# Patient Record
Sex: Female | Born: 1945 | Race: White | Hispanic: No | Marital: Married
Health system: Southern US, Community
[De-identification: ages and names within clinical notes are randomized; demographics above are authoritative.]

## PROBLEM LIST (undated history)

## (undated) DIAGNOSIS — R9431 Abnormal electrocardiogram [ECG] [EKG]: Secondary | ICD-10-CM

## (undated) DIAGNOSIS — F419 Anxiety disorder, unspecified: Secondary | ICD-10-CM

## (undated) DIAGNOSIS — R011 Cardiac murmur, unspecified: Secondary | ICD-10-CM

## (undated) DIAGNOSIS — E538 Deficiency of other specified B group vitamins: Secondary | ICD-10-CM

## (undated) DIAGNOSIS — M130 Polyarthritis, unspecified: Secondary | ICD-10-CM

## (undated) DIAGNOSIS — J329 Chronic sinusitis, unspecified: Secondary | ICD-10-CM

## (undated) DIAGNOSIS — I10 Essential (primary) hypertension: Secondary | ICD-10-CM

## (undated) DIAGNOSIS — F339 Major depressive disorder, recurrent, unspecified: Secondary | ICD-10-CM

## (undated) DIAGNOSIS — E039 Hypothyroidism, unspecified: Secondary | ICD-10-CM

## (undated) DIAGNOSIS — J309 Allergic rhinitis, unspecified: Secondary | ICD-10-CM

## (undated) DIAGNOSIS — E559 Vitamin D deficiency, unspecified: Secondary | ICD-10-CM

## (undated) DIAGNOSIS — Z6839 Body mass index (BMI) 39.0-39.9, adult: Secondary | ICD-10-CM

## (undated) HISTORY — DX: Allergic rhinitis, unspecified: J30.9

## (undated) HISTORY — DX: Cardiac murmur, unspecified: R01.1

## (undated) HISTORY — DX: Essential (primary) hypertension: I10

## (undated) HISTORY — DX: Abnormal electrocardiogram (ECG) (EKG): R94.31

## (undated) HISTORY — DX: Body mass index (BMI) 39.0-39.9, adult: Z68.39

## (undated) HISTORY — DX: Hypomagnesemia: E83.42

## (undated) HISTORY — DX: Hypothyroidism, unspecified: E03.9

## (undated) HISTORY — DX: Deficiency of other specified B group vitamins: E53.8

## (undated) HISTORY — DX: Vitamin D deficiency, unspecified: E55.9

## (undated) HISTORY — DX: Major depressive disorder, recurrent, unspecified: F33.9

## (undated) HISTORY — DX: Anxiety disorder, unspecified: F41.9

## (undated) HISTORY — DX: Chronic sinusitis, unspecified: J32.9

## (undated) HISTORY — DX: Polyarthritis, unspecified: M13.0

---

## 2016-01-04 ENCOUNTER — Inpatient Hospital Stay (HOSPITAL_COMMUNITY)
Admission: EM | Admit: 2016-01-04 | Discharge: 2016-01-09 | DRG: 378 | Disposition: A | Discharge status: Home / Self Care | Payer: Medicare Other | Source: Ambulatory Visit | Attending: Family Medicine | Admitting: Family Medicine

## 2016-01-04 DIAGNOSIS — D649 Anemia, unspecified: Secondary | ICD-10-CM | POA: Diagnosis not present

## 2016-01-04 DIAGNOSIS — Z880 Allergy status to penicillin: Secondary | ICD-10-CM

## 2016-01-04 DIAGNOSIS — F13239 Sedative, hypnotic or anxiolytic dependence with withdrawal, unspecified: Secondary | ICD-10-CM | POA: Diagnosis not present

## 2016-01-04 DIAGNOSIS — N179 Acute kidney failure, unspecified: Secondary | ICD-10-CM | POA: Diagnosis present

## 2016-01-04 DIAGNOSIS — Z6841 Body Mass Index (BMI) 40.0 and over, adult: Secondary | ICD-10-CM | POA: Diagnosis not present

## 2016-01-04 DIAGNOSIS — E872 Acidosis: Secondary | ICD-10-CM | POA: Diagnosis present

## 2016-01-04 DIAGNOSIS — F32A Depression, unspecified: Secondary | ICD-10-CM | POA: Diagnosis present

## 2016-01-04 DIAGNOSIS — F329 Major depressive disorder, single episode, unspecified: Secondary | ICD-10-CM | POA: Diagnosis present

## 2016-01-04 DIAGNOSIS — E871 Hypo-osmolality and hyponatremia: Secondary | ICD-10-CM | POA: Diagnosis not present

## 2016-01-04 DIAGNOSIS — E869 Volume depletion, unspecified: Secondary | ICD-10-CM | POA: Diagnosis not present

## 2016-01-04 DIAGNOSIS — K922 Gastrointestinal hemorrhage, unspecified: Principal | ICD-10-CM | POA: Diagnosis present

## 2016-01-04 DIAGNOSIS — R140 Abdominal distension (gaseous): Secondary | ICD-10-CM | POA: Diagnosis not present

## 2016-01-04 DIAGNOSIS — I10 Essential (primary) hypertension: Secondary | ICD-10-CM

## 2016-01-04 DIAGNOSIS — K921 Melena: Secondary | ICD-10-CM | POA: Diagnosis not present

## 2016-01-04 DIAGNOSIS — R4182 Altered mental status, unspecified: Secondary | ICD-10-CM

## 2016-01-04 DIAGNOSIS — E039 Hypothyroidism, unspecified: Secondary | ICD-10-CM | POA: Diagnosis not present

## 2016-01-04 DIAGNOSIS — K644 Residual hemorrhoidal skin tags: Secondary | ICD-10-CM | POA: Diagnosis present

## 2016-01-04 DIAGNOSIS — R296 Repeated falls: Secondary | ICD-10-CM | POA: Diagnosis not present

## 2016-01-04 DIAGNOSIS — I872 Venous insufficiency (chronic) (peripheral): Secondary | ICD-10-CM | POA: Diagnosis not present

## 2016-01-04 DIAGNOSIS — R410 Disorientation, unspecified: Secondary | ICD-10-CM | POA: Diagnosis present

## 2016-01-04 DIAGNOSIS — R319 Hematuria, unspecified: Secondary | ICD-10-CM | POA: Diagnosis present

## 2016-01-04 DIAGNOSIS — K449 Diaphragmatic hernia without obstruction or gangrene: Secondary | ICD-10-CM | POA: Diagnosis present

## 2016-01-04 DIAGNOSIS — M7989 Other specified soft tissue disorders: Secondary | ICD-10-CM | POA: Diagnosis not present

## 2016-01-04 DIAGNOSIS — I959 Hypotension, unspecified: Secondary | ICD-10-CM | POA: Diagnosis present

## 2016-01-04 DIAGNOSIS — K625 Hemorrhage of anus and rectum: Secondary | ICD-10-CM | POA: Diagnosis not present

## 2016-01-04 DIAGNOSIS — B379 Candidiasis, unspecified: Secondary | ICD-10-CM | POA: Diagnosis present

## 2016-01-04 DIAGNOSIS — N390 Urinary tract infection, site not specified: Secondary | ICD-10-CM | POA: Diagnosis present

## 2016-01-04 DIAGNOSIS — R062 Wheezing: Secondary | ICD-10-CM

## 2016-01-04 DIAGNOSIS — E785 Hyperlipidemia, unspecified: Secondary | ICD-10-CM | POA: Diagnosis present

## 2016-01-04 DIAGNOSIS — Z881 Allergy status to other antibiotic agents status: Secondary | ICD-10-CM

## 2016-01-04 DIAGNOSIS — I119 Hypertensive heart disease without heart failure: Secondary | ICD-10-CM | POA: Diagnosis not present

## 2016-01-04 DIAGNOSIS — Z8249 Family history of ischemic heart disease and other diseases of the circulatory system: Secondary | ICD-10-CM

## 2016-01-04 DIAGNOSIS — Z888 Allergy status to other drugs, medicaments and biological substances status: Secondary | ICD-10-CM

## 2016-01-04 DIAGNOSIS — Z79899 Other long term (current) drug therapy: Secondary | ICD-10-CM

## 2016-01-04 HISTORY — DX: Depression, unspecified: F32.A

## 2016-01-04 HISTORY — DX: Gastrointestinal hemorrhage, unspecified: K92.2

## 2016-01-04 HISTORY — DX: Essential (primary) hypertension: I10

## 2016-01-04 HISTORY — DX: Hyperlipidemia, unspecified: E78.5

## 2016-01-04 LAB — CBC
HEMATOCRIT: 29.4 % — AB (ref 36.0–46.0)
HEMOGLOBIN: 9.8 g/dL — AB (ref 12.0–15.0)
MCH: 28.8 pg (ref 26.0–34.0)
MCHC: 33.3 g/dL (ref 30.0–36.0)
MCV: 86.5 fL (ref 78.0–100.0)
Platelets: 353 10*3/uL (ref 150–400)
RBC: 3.4 MIL/uL — ABNORMAL LOW (ref 3.87–5.11)
RDW: 14.1 % (ref 11.5–15.5)
WBC: 9.3 10*3/uL (ref 4.0–10.5)

## 2016-01-04 LAB — PROTIME-INR
INR: 1.06
PROTHROMBIN TIME: 13.8 s (ref 11.4–15.2)

## 2016-01-04 LAB — COMPREHENSIVE METABOLIC PANEL
ALT: 20 U/L (ref 14–54)
AST: 34 U/L (ref 15–41)
Albumin: 3.8 g/dL (ref 3.5–5.0)
Alkaline Phosphatase: 69 U/L (ref 38–126)
Anion gap: 6 (ref 5–15)
BILIRUBIN TOTAL: 0.7 mg/dL (ref 0.3–1.2)
BUN: 32 mg/dL — AB (ref 6–20)
CHLORIDE: 104 mmol/L (ref 101–111)
CO2: 23 mmol/L (ref 22–32)
CREATININE: 1.34 mg/dL — AB (ref 0.44–1.00)
Calcium: 9.3 mg/dL (ref 8.9–10.3)
GFR calc Af Amer: 46 mL/min — ABNORMAL LOW (ref >60)
GFR, EST NON AFRICAN AMERICAN: 40 mL/min — AB (ref >60)
Glucose, Bld: 106 mg/dL — ABNORMAL HIGH (ref 65–99)
Potassium: 4.5 mmol/L (ref 3.5–5.1)
Sodium: 133 mmol/L — ABNORMAL LOW (ref 135–145)
Total Protein: 7.6 g/dL (ref 6.5–8.1)

## 2016-01-04 LAB — TSH: TSH: 2.727 u[IU]/mL (ref 0.350–4.500)

## 2016-01-04 MED ORDER — METOPROLOL TARTRATE 25 MG PO TABS: 25.0000 mg | ORAL_TABLET | Freq: Two times a day (BID) | ORAL | Status: DC

## 2016-01-04 MED ORDER — ONDANSETRON HCL 4 MG/2ML IJ SOLN
4.0000 mg | Freq: Four times a day (QID) | INTRAMUSCULAR | Status: DC | PRN
  Administered 2016-01-04: 4 mg via INTRAVENOUS
  Filled 2016-01-04: qty 2

## 2016-01-04 MED ORDER — LEVOTHYROXINE SODIUM 112 MCG PO TABS
112.0000 ug | ORAL_TABLET | Freq: Every day | ORAL | Status: DC
  Administered 2016-01-05 – 2016-01-09 (×5): 112 ug via ORAL
  Filled 2016-01-04 (×5): qty 1

## 2016-01-04 MED ORDER — ONDANSETRON HCL 4 MG PO TABS: 4.0000 mg | ORAL_TABLET | Freq: Four times a day (QID) | ORAL | Status: DC | PRN

## 2016-01-04 MED ORDER — SIMVASTATIN 10 MG PO TABS
20.0000 mg | ORAL_TABLET | Freq: Every day | ORAL | Status: DC
  Administered 2016-01-04 – 2016-01-08 (×5): 20 mg via ORAL
  Filled 2016-01-04 (×5): qty 2

## 2016-01-04 MED ORDER — TRAMADOL HCL 50 MG PO TABS
25.0000 mg | ORAL_TABLET | Freq: Four times a day (QID) | ORAL | Status: DC | PRN
  Administered 2016-01-04 – 2016-01-05 (×2): 25 mg via ORAL
  Filled 2016-01-04 (×2): qty 1

## 2016-01-04 MED ORDER — ALPRAZOLAM 0.5 MG PO TABS
0.5000 mg | ORAL_TABLET | Freq: Four times a day (QID) | ORAL | Status: DC | PRN
  Administered 2016-01-04 – 2016-01-06 (×4): 0.5 mg via ORAL
  Filled 2016-01-04 (×5): qty 1

## 2016-01-04 MED ORDER — PEG 3350-KCL-NA BICARB-NACL 420 G PO SOLR
4000.0000 mL | Freq: Once | ORAL | Status: AC
  Administered 2016-01-04: 4000 mL via ORAL

## 2016-01-04 MED ORDER — CITALOPRAM HYDROBROMIDE 20 MG PO TABS
40.0000 mg | ORAL_TABLET | Freq: Every day | ORAL | Status: DC
  Administered 2016-01-04 – 2016-01-06 (×3): 40 mg via ORAL
  Filled 2016-01-04 (×3): qty 2

## 2016-01-04 MED ORDER — SODIUM CHLORIDE 0.9 % IV SOLN
INTRAVENOUS | Status: DC
  Administered 2016-01-04: 17:00:00 via INTRAVENOUS

## 2016-01-04 MED ORDER — AMITRIPTYLINE HCL 25 MG PO TABS
50.0000 mg | ORAL_TABLET | Freq: Every day | ORAL | Status: DC
  Administered 2016-01-04 – 2016-01-06 (×3): 50 mg via ORAL
  Filled 2016-01-04 (×4): qty 2

## 2016-01-04 NOTE — Progress Notes (Signed)
RN received report, Pt arrived unit from [facility]. Pt is alert and oriented, able to communicate needs. MD notified of Pt's location. Will continue with current plan of care.

## 2016-01-04 NOTE — Consult Note (Signed)
UNASSIGNED PATIENT Reason for Consult: Rectal bleeding and anemia. Referring Physician: THP   HPI: Diane Hernandez is a 70 year old, morbidly white female who was transferred to [facility] after she presented there with a three-day history of rectal bleeding. Initially she complained of vaginal bleeding but on exam at the hospital she was found to have rectal bleeding and was transferred here as there was no gastroenterologist available at that hospital for consultation. The symptoms have been going on for the last 3 days she has a sense of fecal urgency and the commode gets filled with blood mixed with stool. She tends to have occasional bouts of constipation where she can go 2-3 days without a BM. At times she has 1-2 loose stools per day. She denies having any abdominal pain nausea or vomiting. She has a good appetite is good and her weight has been stable. There is no history of ulcers jaundice or colitis. There is history of nonsteroidal use or abuse. Patient denies family history of colon cancer celiac sprue or IBD. Patient has never had a colonoscopy.    PAST MEDICAL HISTORY 1) Hypertension  2) Hyperlipidemia  3) Hypothyroidism 4) Depression  5) Morbid obesity  PAST SURGICAL HISTORY  Patient has not had any surgeries.   FAMILY HISTORY There is no family history of colon cancer; her parents died of CAD. Several family mebers have problems with depression.  SOCIAL HISTORY  has no tobacco, alcohol, and drug history on file.  Allergies:  Allergies  Allergen Reactions  . Omnicef [Cefdinir] Diarrhea  . Penicillins Hives    Has patient had a PCN reaction causing immediate rash, facial/tongue/throat swelling, SOB or lightheadedness with hypotension: No Has patient had a PCN reaction causing severe rash involving mucus membranes or skin necrosis: Yes Has patient had a PCN reaction that required hospitalization No Has patient had a PCN reaction occurring within the last  10 years: No If all of the above answers are "NO", then may proceed with Cephalosporin use.   . Prednisone Other (See Comments)    Makes very jittery    Medications: I have reviewed the patient's current medications.  Results for orders placed or performed during the hospital encounter of 01/04/16 (from the past 48 hour(s))  CBC     Status: Abnormal   Collection Time: 01/04/16  3:25 PM  Result Value Ref Range   WBC 9.3 4.0 - 10.5 K/uL   RBC 3.40 (L) 3.87 - 5.11 MIL/uL   Hemoglobin 9.8 (L) 12.0 - 15.0 g/dL   HCT 16.129.4 (L) 09.636.0 - 04.546.0 %   MCV 86.5 78.0 - 100.0 fL   MCH 28.8 26.0 - 34.0 pg   MCHC 33.3 30.0 - 36.0 g/dL   RDW 40.914.1 81.111.5 - 91.415.5 %   Platelets 353 150 - 400 K/uL   Review of Systems  Constitutional: Negative.   HENT: Negative.   Eyes: Negative.   Respiratory: Negative.   Cardiovascular: Negative.   Gastrointestinal: Positive for blood in stool.  Genitourinary: Negative.   Musculoskeletal: Negative.   Skin: Negative.   Neurological: Negative.   Endo/Heme/Allergies: Negative.   Psychiatric/Behavioral: Positive for depression.   Blood pressure 134/63, pulse 87, temperature 98.5 F (36.9 C), temperature source Oral, resp. rate 16, SpO2 99 %. Physical Exam  Constitutional: She is oriented to person, place, and time. She appears well-developed and well-nourished.  HENT:  Head: Normocephalic and atraumatic.  Neck: Normal range of motion. Neck supple.  Cardiovascular: Normal rate and regular rhythm.  Respiratory: Effort normal and breath sounds normal.  GI: Soft. Bowel sounds are normal.  Musculoskeletal: Normal range of motion.  Neurological: She is alert and oriented to person, place, and time.  Skin: Skin is warm and dry.  Psychiatric: She has a normal mood and affect. Her behavior is normal. Judgment and thought content normal.   Assessment/Plan: 1) Rectal bleeding and anemia: will plan a colonoscopy for tomorrow. Will prep tonite. 2)  Hypertension/hyperlipidemia. 3) Depression  4) Hypothyroidism.  5) Morbid obesity. Riyansh Gerstner 01/04/2016, 3:42 PM

## 2016-01-04 NOTE — H&P (Addendum)
History and Physical    Diane DallasBrenda Pfund ZOX:[id number] DOB: Sep 23, 1945 DOA: 01/04/2016  PCP: Dema SeverinYORK,REGINA F, NP  Patient coming from: [facility]  Chief Complaint: Bright red blood  HPI: Diane Hernandez is a 70 y.o. female with medical history significant of hypertension, hyperlipidemia, obesity, hypothyroidism, presents [facility] the [facility] emergency room with complaints of 3 day of what she initially thought was vaginal bleeding. Patient describes that she has been incontinent and rushing to the bathroom, and she saw bright red blood in the toilet bowl. She thinks she had about a dozen episodes over the last 3 days, this morning's episode was with a lot of blood, she got scared and decided to come to the emergency room. This has never happened to her before. She never had a colonoscopy, does not have a gastroenterologist. She denies any fever or chills, denies any chest pain, complains of mild lightheadedness denies any dizziness. She has no abdominal pain, no nausea, vomiting or diarrhea. She was about in the emergency room there, she underwent a pelvic exam which showed no evidence of vaginal bleeding however digital rectal bleeding. Her vital signs are stable, blood work remarkable for hemoglobin of 9.8 with unknown baseline. White count is 9.8, platelets are 306, sodium 1:30, potassium 4.7, chloride 101, bicarbonate 20, BUN 36 and creatinine 1.5. Glucose was 97. Liver enzymes are unremarkable. [facility] not have a GI coverage and patient was transferred to [facility] for [facility] evaluation.  In addition, patient complains of recurrent falls, poor balance, and recently had a rib fracture.  Review of Systems: As per HPI otherwise 10 point review of systems negative.   Past medical history Hypothyroidism Hypertension Hyperlipidemia Obesity Depression  Social history Denies smoking, denies any alcohol intake  Family history Her father had a heart attack, mother with Alzheimer  dementia  Home medications Lisinopril 10 mg daily Synthroid 112 mcg Daily  Metoprolol 25 mg twice a day Simvastatin 20 mg daily Amitriptyline 50 mg daily Medroxyprogesterone 2.5 mg Celexa 40 mg daily Xanax 0.5 mg 4 times daily as needed Tramadol 50 mg as needed for pain  Allergies  Cefdinir - diarrhea Penicillins - Hives Prednisone - irregular heart beat   Physical Exam: Vitals:   01/04/16 1433  BP: 134/63  Pulse: 87  Resp: 16  Temp: 98.5 F (36.9 C)  TempSrc: Oral  SpO2: 99%    Constitutional: NAD, calm, comfortable Vitals:   01/04/16 1433  BP: 134/63  Pulse: 87  Resp: 16  Temp: 98.5 F (36.9 C)  TempSrc: Oral  SpO2: 99%   Eyes: PERRL, lids and conjunctivae normal ENMT: Mucous membranes are moist. Posterior pharynx clear of any exudate or lesions  Neck: normal, supple  Respiratory: clear to auscultation bilaterally, no wheezing, no crackles. Normal respiratory effort. No accessory muscle use.  Cardiovascular: Regular rate and rhythm, no murmurs / rubs / gallops. 1+ extremity edema. 2+ pedal pulses.  Abdomen: no tenderness, no masses palpated. Bowel sounds positive.  Musculoskeletal: no clubbing / cyanosis. Normal muscle tone.  Skin: no rashes, lesions, ulcers. No induration Neurologic: non focal Psychiatric: Alert and oriented x 3. Normal mood.   Labs on Admission: I have personally reviewed following labs and imaging studies  CBC: No results for input(s): WBC, NEUTROABS, HGB, HCT, MCV, PLT in the last 168 hours. Basic Metabolic Panel: No results for input(s): NA, K, CL, CO2, GLUCOSE, BUN, CREATININE, CALCIUM, MG, PHOS in the last 168 hours. GFR: CrCl cannot be calculated (Unknown ideal weight.). Liver Function  Tests: No results for input(s): AST, ALT, ALKPHOS, BILITOT, PROT, ALBUMIN in the last 168 hours. No results for input(s): LIPASE, AMYLASE in the last 168 hours. No results for input(s): AMMONIA in the last 168 hours. Coagulation Profile: No  results for input(s): INR, PROTIME in the last 168 hours. Cardiac Enzymes: No results for input(s): CKTOTAL, CKMB, CKMBINDEX, TROPONINI in the last 168 hours. BNP (last 3 results) No results for input(s): PROBNP in the last 8760 hours. HbA1C: No results for input(s): HGBA1C in the last 72 hours. CBG: No results for input(s): GLUCAP in the last 168 hours. Lipid Profile: No results for input(s): CHOL, HDL, LDLCALC, TRIG, CHOLHDL, LDLDIRECT in the last 72 hours. Thyroid Function Tests: No results for input(s): TSH, T4TOTAL, FREET4, T3FREE, THYROIDAB in the last 72 hours. Anemia Panel: No results for input(s): VITAMINB12, FOLATE, FERRITIN, TIBC, IRON, RETICCTPCT in the last 72 hours. Urine analysis: No results found for: COLORURINE, APPEARANCEUR, LABSPEC, PHURINE, GLUCOSEU, HGBUR, BILIRUBINUR, KETONESUR, PROTEINUR, UROBILINOGEN, NITRITE, LEUKOCYTESUR    Assessment/Plan Active Problems:   GI bleed   Hypothyroidism   HTN (hypertension)   Depression   HLD (hyperlipidemia)    GI bleed - appears to have lower GI bleeding, possibly diverticular - GHI consulted, discussed with Dr. Loreta Ave - check CBC now, keep NPO - appears hemodynamically stable - Gentle fluids   Hypothyroidism  - Resume Synthroid, check TSH  Hypertension  - Hold lisinopril due to creatinine 1.5   ? Chronic kidney disease vs AKI - Creatinine 1.5, unknown baseline, continue to monitor  Hyperlipidemia - Continue simvastatin  Bilateral lower extremity swelling - Likely venous insufficiency, she is no orthopnea, no crackles on exam, no JVD, less likely cardiac - TED compression  Recurrent falls - Obtain PT consult  DVT prophylaxis: SCD  Code Status: Full  Family Communication: husband bedside Disposition Plan: medsurg Consults called: GI  Admission status: obs    Pamella Pert, MD [facility] Pager [phone]  If 7PM-7AM, please contact night-coverage [email or web address] Password  [facility]  01/04/2016, 2:56 PM

## 2016-01-05 ENCOUNTER — Observation Stay (HOSPITAL_COMMUNITY): Payer: Medicare Other

## 2016-01-05 ENCOUNTER — Encounter (HOSPITAL_COMMUNITY)
Admission: EM | Disposition: A | Discharge status: Home / Self Care | Payer: Self-pay | Source: Ambulatory Visit | Attending: Family Medicine

## 2016-01-05 ENCOUNTER — Encounter (HOSPITAL_COMMUNITY): Payer: Self-pay

## 2016-01-05 DIAGNOSIS — K922 Gastrointestinal hemorrhage, unspecified: Secondary | ICD-10-CM | POA: Diagnosis present

## 2016-01-05 DIAGNOSIS — K449 Diaphragmatic hernia without obstruction or gangrene: Secondary | ICD-10-CM | POA: Diagnosis present

## 2016-01-05 DIAGNOSIS — D62 Acute posthemorrhagic anemia: Secondary | ICD-10-CM | POA: Diagnosis not present

## 2016-01-05 DIAGNOSIS — I10 Essential (primary) hypertension: Secondary | ICD-10-CM | POA: Diagnosis not present

## 2016-01-05 DIAGNOSIS — E785 Hyperlipidemia, unspecified: Secondary | ICD-10-CM | POA: Diagnosis present

## 2016-01-05 DIAGNOSIS — K644 Residual hemorrhoidal skin tags: Secondary | ICD-10-CM | POA: Diagnosis present

## 2016-01-05 DIAGNOSIS — E039 Hypothyroidism, unspecified: Secondary | ICD-10-CM | POA: Diagnosis present

## 2016-01-05 DIAGNOSIS — Z6841 Body Mass Index (BMI) 40.0 and over, adult: Secondary | ICD-10-CM | POA: Diagnosis not present

## 2016-01-05 DIAGNOSIS — R140 Abdominal distension (gaseous): Secondary | ICD-10-CM | POA: Diagnosis present

## 2016-01-05 DIAGNOSIS — B379 Candidiasis, unspecified: Secondary | ICD-10-CM | POA: Diagnosis present

## 2016-01-05 DIAGNOSIS — E872 Acidosis: Secondary | ICD-10-CM | POA: Diagnosis present

## 2016-01-05 DIAGNOSIS — I872 Venous insufficiency (chronic) (peripheral): Secondary | ICD-10-CM | POA: Diagnosis present

## 2016-01-05 DIAGNOSIS — K625 Hemorrhage of anus and rectum: Secondary | ICD-10-CM | POA: Diagnosis not present

## 2016-01-05 DIAGNOSIS — F13239 Sedative, hypnotic or anxiolytic dependence with withdrawal, unspecified: Secondary | ICD-10-CM | POA: Diagnosis present

## 2016-01-05 DIAGNOSIS — E871 Hypo-osmolality and hyponatremia: Secondary | ICD-10-CM | POA: Diagnosis present

## 2016-01-05 DIAGNOSIS — R410 Disorientation, unspecified: Secondary | ICD-10-CM | POA: Diagnosis present

## 2016-01-05 DIAGNOSIS — E869 Volume depletion, unspecified: Secondary | ICD-10-CM | POA: Diagnosis present

## 2016-01-05 DIAGNOSIS — I959 Hypotension, unspecified: Secondary | ICD-10-CM

## 2016-01-05 DIAGNOSIS — F329 Major depressive disorder, single episode, unspecified: Secondary | ICD-10-CM | POA: Diagnosis present

## 2016-01-05 DIAGNOSIS — D649 Anemia, unspecified: Secondary | ICD-10-CM | POA: Diagnosis present

## 2016-01-05 DIAGNOSIS — K921 Melena: Secondary | ICD-10-CM | POA: Diagnosis present

## 2016-01-05 DIAGNOSIS — M7989 Other specified soft tissue disorders: Secondary | ICD-10-CM | POA: Diagnosis present

## 2016-01-05 DIAGNOSIS — R296 Repeated falls: Secondary | ICD-10-CM | POA: Diagnosis present

## 2016-01-05 DIAGNOSIS — R319 Hematuria, unspecified: Secondary | ICD-10-CM | POA: Diagnosis present

## 2016-01-05 DIAGNOSIS — N179 Acute kidney failure, unspecified: Secondary | ICD-10-CM | POA: Diagnosis present

## 2016-01-05 DIAGNOSIS — I119 Hypertensive heart disease without heart failure: Secondary | ICD-10-CM | POA: Diagnosis present

## 2016-01-05 DIAGNOSIS — N390 Urinary tract infection, site not specified: Secondary | ICD-10-CM | POA: Diagnosis present

## 2016-01-05 HISTORY — PX: FLEXIBLE SIGMOIDOSCOPY: SHX5431

## 2016-01-05 HISTORY — PX: ESOPHAGOGASTRODUODENOSCOPY: SHX5428

## 2016-01-05 LAB — CBC
HEMATOCRIT: 23.3 % — AB (ref 36.0–46.0)
Hemoglobin: 7.8 g/dL — ABNORMAL LOW (ref 12.0–15.0)
MCH: 28.5 pg (ref 26.0–34.0)
MCHC: 33.5 g/dL (ref 30.0–36.0)
MCV: 85 fL (ref 78.0–100.0)
Platelets: 302 10*3/uL (ref 150–400)
RBC: 2.74 MIL/uL — ABNORMAL LOW (ref 3.87–5.11)
RDW: 14.2 % (ref 11.5–15.5)
WBC: 10.3 10*3/uL (ref 4.0–10.5)

## 2016-01-05 LAB — BASIC METABOLIC PANEL
Anion gap: 9 (ref 5–15)
BUN: 32 mg/dL — AB (ref 6–20)
CHLORIDE: 103 mmol/L (ref 101–111)
CO2: 19 mmol/L — AB (ref 22–32)
Calcium: 8.5 mg/dL — ABNORMAL LOW (ref 8.9–10.3)
Creatinine, Ser: 1.36 mg/dL — ABNORMAL HIGH (ref 0.44–1.00)
GFR calc Af Amer: 45 mL/min — ABNORMAL LOW (ref >60)
GFR calc non Af Amer: 39 mL/min — ABNORMAL LOW (ref >60)
GLUCOSE: 127 mg/dL — AB (ref 65–99)
POTASSIUM: 4.5 mmol/L (ref 3.5–5.1)
SODIUM: 131 mmol/L — AB (ref 135–145)

## 2016-01-05 LAB — LACTIC ACID, PLASMA
Lactic Acid, Venous: 1.1 mmol/L (ref 0.5–1.9)
Lactic Acid, Venous: 1.7 mmol/L (ref 0.5–1.9)

## 2016-01-05 LAB — ABO/RH: ABO/RH(D): A POS

## 2016-01-05 LAB — MRSA PCR SCREENING: MRSA by PCR: POSITIVE — AB

## 2016-01-05 LAB — OSMOLALITY: Osmolality: 282 mOsm/kg (ref 275–295)

## 2016-01-05 LAB — HEMOGLOBIN AND HEMATOCRIT, BLOOD
HCT: 22.7 % — ABNORMAL LOW (ref 36.0–46.0)
HEMOGLOBIN: 7.8 g/dL — AB (ref 12.0–15.0)

## 2016-01-05 LAB — PREPARE RBC (CROSSMATCH)

## 2016-01-05 SURGERY — COLONOSCOPY
Anesthesia: Moderate Sedation | Laterality: Left

## 2016-01-05 SURGERY — EGD (ESOPHAGOGASTRODUODENOSCOPY)
Anesthesia: Moderate Sedation

## 2016-01-05 MED ORDER — SODIUM CHLORIDE 0.9 % IV SOLN
Freq: Once | INTRAVENOUS | Status: AC
  Administered 2016-01-05: 16:00:00 via INTRAVENOUS

## 2016-01-05 MED ORDER — FAMOTIDINE IN NACL 20-0.9 MG/50ML-% IV SOLN
20.0000 mg | Freq: Two times a day (BID) | INTRAVENOUS | Status: DC
  Administered 2016-01-05 – 2016-01-09 (×9): 20 mg via INTRAVENOUS
  Filled 2016-01-05 (×12): qty 50

## 2016-01-05 MED ORDER — LORAZEPAM 2 MG/ML IJ SOLN
INTRAMUSCULAR | Status: AC
  Filled 2016-01-05: qty 1

## 2016-01-05 MED ORDER — MIDAZOLAM HCL 5 MG/5ML IJ SOLN
INTRAMUSCULAR | Status: DC | PRN
  Administered 2016-01-05 (×2): 2 mg via INTRAVENOUS

## 2016-01-05 MED ORDER — METOPROLOL TARTRATE 12.5 MG HALF TABLET
12.5000 mg | ORAL_TABLET | Freq: Two times a day (BID) | ORAL | Status: DC
  Administered 2016-01-05 – 2016-01-09 (×7): 12.5 mg via ORAL
  Filled 2016-01-05 (×8): qty 1

## 2016-01-05 MED ORDER — MUPIROCIN 2 % EX OINT
1.0000 "application " | TOPICAL_OINTMENT | Freq: Two times a day (BID) | CUTANEOUS | Status: DC
  Administered 2016-01-05 – 2016-01-09 (×8): 1 via NASAL
  Filled 2016-01-05 (×2): qty 22

## 2016-01-05 MED ORDER — FENTANYL CITRATE (PF) 100 MCG/2ML IJ SOLN
INTRAMUSCULAR | Status: AC
  Filled 2016-01-05: qty 2

## 2016-01-05 MED ORDER — ORAL CARE MOUTH RINSE
15.0000 mL | Freq: Two times a day (BID) | OROMUCOSAL | Status: DC
  Administered 2016-01-05 – 2016-01-09 (×6): 15 mL via OROMUCOSAL

## 2016-01-05 MED ORDER — FENTANYL CITRATE (PF) 100 MCG/2ML IJ SOLN
INTRAMUSCULAR | Status: DC | PRN
  Administered 2016-01-05: 25 ug via INTRAVENOUS

## 2016-01-05 MED ORDER — SODIUM CHLORIDE 0.9 % IV SOLN
Freq: Once | INTRAVENOUS | Status: AC
  Administered 2016-01-05: 17:00:00 via INTRAVENOUS

## 2016-01-05 MED ORDER — POLYETHYLENE GLYCOL 3350 17 GM/SCOOP PO POWD
238.0000 g | Freq: Once | ORAL | Status: AC
  Administered 2016-01-05: 238 g via ORAL
  Filled 2016-01-05: qty 238

## 2016-01-05 MED ORDER — HALOPERIDOL LACTATE 5 MG/ML IJ SOLN
5.0000 mg | Freq: Once | INTRAMUSCULAR | Status: AC
  Administered 2016-01-05: 5 mg via INTRAVENOUS
  Filled 2016-01-05: qty 1

## 2016-01-05 MED ORDER — MIDAZOLAM HCL 5 MG/ML IJ SOLN
INTRAMUSCULAR | Status: AC
  Filled 2016-01-05: qty 2

## 2016-01-05 MED ORDER — CHLORHEXIDINE GLUCONATE CLOTH 2 % EX PADS
6.0000 | MEDICATED_PAD | Freq: Every day | CUTANEOUS | Status: DC
  Administered 2016-01-06 – 2016-01-09 (×4): 6 via TOPICAL

## 2016-01-05 MED ORDER — SODIUM CHLORIDE 0.9 % IV SOLN: INTRAVENOUS | Status: DC

## 2016-01-05 MED ORDER — SODIUM CHLORIDE 0.9 % IV SOLN
INTRAVENOUS | Status: DC
  Administered 2016-01-06 – 2016-01-08 (×3): via INTRAVENOUS

## 2016-01-05 MED ORDER — LORAZEPAM 2 MG/ML IJ SOLN
2.0000 mg | Freq: Once | INTRAMUSCULAR | Status: AC
  Administered 2016-01-05: 2 mg via INTRAVENOUS

## 2016-01-05 NOTE — Interval H&P Note (Signed)
History and Physical Interval Note:  01/05/2016 3:34 PM  Diane DallasBrenda Hernandez  has presented today for surgery, with the diagnosis of rectal bleeding  The various methods of treatment have been discussed with the patient and family. After consideration of risks, benefits and other options for treatment, the patient has consented to  Procedure(s): COLONOSCOPY (N/A) as a surgical intervention .  The patient's history has been reviewed, patient examined, no change in status, stable for surgery.  I have reviewed the patient's chart and labs.  Questions were answered to the patient's satisfaction.     Jancarlos Thrun D

## 2016-01-05 NOTE — Progress Notes (Signed)
8/29 2100 - Pt confused.  Repeatedly wanting to get OOB to Bedside Commode.  Since 1900 tonight (2 hours) she has been assisted to [facility] six times without any stool results.  [place], unaware that she is in the hospital, sates she wants to go to the living room.  Staff and I have attempted to reorient patient with no success.  Attempted to distract with TV with no success.  Very impulsive with movements, safety sitter at bedside, pt refuses to advise the safety sitter on need to get up, but continues to put legs off the side of the bed in an attempt to get OOB.  I was called to assist with assessing situation.  Pt was on bed pan when entering the room, pt does not comprehend what the bed pan is for, still trying to get up.  Unable to reason with patient, I assisted pt up to [facility], steady gait, able to balance self with no difficulty.  After 15-20 minutes on BSC, no results, pt redirected and assisted back to bed to to patient wanting to go out in the hall, thinking it was the living room.  Pt sitting on side of bed refusing to sit down, I was going to advised her that we could have he sit in the recliner, but before I good do that pt stood up and tried going out in the hall.  During that situation, pt pulled monitor leads off and pulled IV out.  Pt assisted back to the bed with assist of three to four other staff members.  Once pt in bed, IV restarted.  Pt is known to me from [facility] admit a long time ago, and I remember pt was confused then but unable to remember the reason why.  Rollen SoxKatherine Schorr,NP paged and notified of situation.  Ativan 2mg  IV ordered and given.  Natalia LeatherwoodKatherine advised to restrain if Ativan not effective.  Pt restlessness and agitation get worse, pt again attempting to get OOB and now trying to push staff out of the way.  Katherine Schorr,NP repaged.  Pt restrained - bilat. wrist, husband called an notifed, husband ok with use of restraints.    Jewel BaizeHans C  Gayna Braddy,RN,BSN,CCRN

## 2016-01-05 NOTE — H&P (View-Only) (Signed)
UNASSIGNED PATIENT Reason for Consult: Rectal bleeding and anemia. Referring Physician: THP   HPI: Diane Hernandez is a 70 year old, morbidly white female who was transferred to [facility] after she presented there with a three-day history of rectal bleeding. Initially she complained of vaginal bleeding but on exam at the hospital she was found to have rectal bleeding and was transferred here as there was no gastroenterologist available at that hospital for consultation. The symptoms have been going on for the last 3 days she has a sense of fecal urgency and the commode gets filled with blood mixed with stool. She tends to have occasional bouts of constipation where she can go 2-3 days without a BM. At times she has 1-2 loose stools per day. She denies having any abdominal pain nausea or vomiting. She has a good appetite is good and her weight has been stable. There is no history of ulcers jaundice or colitis. There is history of nonsteroidal use or abuse. Patient denies family history of colon cancer celiac sprue or IBD. Patient has never had a colonoscopy.    PAST MEDICAL HISTORY 1) Hypertension  2) Hyperlipidemia  3) Hypothyroidism 4) Depression  5) Morbid obesity  PAST SURGICAL HISTORY  Patient has not had any surgeries.   FAMILY HISTORY There is no family history of colon cancer; her parents died of CAD. Several family mebers have problems with depression.  SOCIAL HISTORY  has no tobacco, alcohol, and drug history on file.  Allergies:  Allergies  Allergen Reactions  . Omnicef [Cefdinir] Diarrhea  . Penicillins Hives    Has patient had a PCN reaction causing immediate rash, facial/tongue/throat swelling, SOB or lightheadedness with hypotension: No Has patient had a PCN reaction causing severe rash involving mucus membranes or skin necrosis: Yes Has patient had a PCN reaction that required hospitalization No Has patient had a PCN reaction occurring within the last  10 years: No If all of the above answers are "NO", then may proceed with Cephalosporin use.   . Prednisone Other (See Comments)    Makes very jittery    Medications: I have reviewed the patient's current medications.  Results for orders placed or performed during the hospital encounter of 01/04/16 (from the past 48 hour(s))  CBC     Status: Abnormal   Collection Time: 01/04/16  3:25 PM  Result Value Ref Range   WBC 9.3 4.0 - 10.5 K/uL   RBC 3.40 (L) 3.87 - 5.11 MIL/uL   Hemoglobin 9.8 (L) 12.0 - 15.0 g/dL   HCT 16.129.4 (L) 09.636.0 - 04.546.0 %   MCV 86.5 78.0 - 100.0 fL   MCH 28.8 26.0 - 34.0 pg   MCHC 33.3 30.0 - 36.0 g/dL   RDW 40.914.1 81.111.5 - 91.415.5 %   Platelets 353 150 - 400 K/uL   Review of Systems  Constitutional: Negative.   HENT: Negative.   Eyes: Negative.   Respiratory: Negative.   Cardiovascular: Negative.   Gastrointestinal: Positive for blood in stool.  Genitourinary: Negative.   Musculoskeletal: Negative.   Skin: Negative.   Neurological: Negative.   Endo/Heme/Allergies: Negative.   Psychiatric/Behavioral: Positive for depression.   Blood pressure 134/63, pulse 87, temperature 98.5 F (36.9 C), temperature source Oral, resp. rate 16, SpO2 99 %. Physical Exam  Constitutional: She is oriented to person, place, and time. She appears well-developed and well-nourished.  HENT:  Head: Normocephalic and atraumatic.  Neck: Normal range of motion. Neck supple.  Cardiovascular: Normal rate and regular rhythm.  Respiratory: Effort normal and breath sounds normal.  GI: Soft. Bowel sounds are normal.  Musculoskeletal: Normal range of motion.  Neurological: She is alert and oriented to person, place, and time.  Skin: Skin is warm and dry.  Psychiatric: She has a normal mood and affect. Her behavior is normal. Judgment and thought content normal.   Assessment/Plan: 1) Rectal bleeding and anemia: will plan a colonoscopy for tomorrow. Will prep tonite. 2)  Hypertension/hyperlipidemia. 3) Depression  4) Hypothyroidism.  5) Morbid obesity. Artice Bergerson 01/04/2016, 3:42 PM

## 2016-01-05 NOTE — Progress Notes (Addendum)
PROGRESS NOTE    Diane Hernandez  ZOX:[id number] DOB: 08/14/1945 DOA: 01/04/2016 PCP: Diane SeverinYORK,Diane F, NP  Outpatient Specialists:   Brief Narrative: 70 y.o. female with medical history significant of hypertension, hyperlipidemia, obesity, hypothyroidism, [facility] with complaints of 3 day of what she initially thought was vaginal bleeding. Patient describes that she has been incontinent and rushing to the bathroom, and she saw bright red blood in the toilet bowl. She thinks she had about a dozen episodes over the last 3 days, this morning's episode was with a lot of blood, she got scared and decided to come to the emergency room. This has never happened to her before. She never had a colonoscopy, does not have a gastroenterologist. She denies any fever or chills, denies any chest pain, complains of mild lightheadedness denies any dizziness. She has no abdominal pain, no nausea, vomiting or diarrhea. She was about in the emergency room there, she underwent a pelvic exam which showed no evidence of vaginal bleeding however digital rectal bleeding. Her vital signs are stable, blood work remarkable for hemoglobin of 9.8 with unknown baseline. White count is 9.8, platelets are 306, sodium 1:30, potassium 4.7, chloride 101, bicarbonate 20, BUN 36 and creatinine 1.5. Glucose was 97. Liver enzymes are unremarkable. [facility] not [facility] GI coverage and [facility] was [facility].  In addition, patient complains of recurrent falls, poor balance, and recently had a rib fracture.  Assessment & Plan:   Active Problems:   GI bleed   Hypothyroidism   HTN (hypertension)   Depression   HLD (hyperlipidemia)   GI bleed - GI consulted. Prep for likely Lower GI scope in progress. Continue to monitor H/H. Supportive care.  Hyponatremia - Suspect secondary to volume depletion. Check urine sodium and creatinine. Check urine and serum osmolality. IVF.  Monitor sodium level.  Acidosis, likely metabolic - Will check lactic acid level. Further management will depend on hospital course.  Abdominal Distension - Reported by patient. No abdominal pain. Abd KUB  Hypothyroidism  - Continue Synthroid.  Hypertension/Hypotension  - Optimize antihypertensive -Keep SBP greater than 130mmHg -Increase IVF to 100cc/hr  ? Chronic kidney disease vs AKI - Creatinine 1.5, unknown baseline, continue to monitor  Hyperlipidemia - Continue simvastatin  Bilateral lower extremity swelling - Likely venous insufficiency, she is no orthopnea, no crackles on exam, no JVD, less likely cardiac - TED compression  Recurrent falls - Obtain PT consult  DVT prophylaxis: SCD  Code Status: Full  Family Communication:   Disposition Plan: medsurg Consults called: GI  Admission status: obs   Procedures:   None  Antimicrobials:   None  Subjective: No new complaints. Transient abdominal pain has resolved. Not clear if there is abdominal distension.  Objective: Vitals:   01/04/16 1433 01/04/16 1900 01/05/16 0608  BP: 134/63 (!) 114/57 102/88  Pulse: 87 85 61  Resp: 16 16 16   Temp: 98.5 Hernandez (36.9 C) 97.8 Hernandez (36.6 C) 97.8 Hernandez (36.6 C)  TempSrc: Oral Oral Oral  SpO2: 99% 91% 100%   No intake or output data in the 24 hours ending 01/05/16 0910 There were no vitals filed for this visit.  Examination:  General exam: Appears calm and comfortable. Morbidly obese.  Respiratory system: Clear to auscultation. Respiratory effort normal. Cardiovascular system: S1 & S2.. Gastrointestinal system: Abdomen is ?distended, soft and nontender. Organs are difficult to assess.  Central nervous system: Alert and oriented. Moves all limbs. Extremities: Symmetric 5  x 5 power.   Data Reviewed: I have personally reviewed following labs and imaging studies  CBC:  Recent Labs Lab 01/04/16 1525 01/05/16 0538  WBC 9.3 10.3  HGB 9.8* 7.8*  HCT 29.4* 23.3*    MCV 86.5 85.0  PLT 353 302   Basic Metabolic Panel:  Recent Labs Lab 01/04/16 1525 01/05/16 0538  NA 133* 131*  K 4.5 4.5  CL 104 103  CO2 23 19*  GLUCOSE 106* 127*  BUN 32* 32*  CREATININE 1.34* 1.36*  CALCIUM 9.3 8.5*   GFR: CrCl cannot be calculated (Unknown ideal weight.). Liver Function Tests:  Recent Labs Lab 01/04/16 1525  AST 34  ALT 20  ALKPHOS 69  BILITOT 0.7  PROT 7.6  ALBUMIN 3.8   No results for input(s): LIPASE, AMYLASE in the last 168 hours. No results for input(s): AMMONIA in the last 168 hours. Coagulation Profile:  Recent Labs Lab 01/04/16 1525  INR 1.06   Cardiac Enzymes: No results for input(s): CKTOTAL, CKMB, CKMBINDEX, TROPONINI in the last 168 hours. BNP (last 3 results) No results for input(s): PROBNP in the last 8760 hours. HbA1C: No results for input(s): HGBA1C in the last 72 hours. CBG: No results for input(s): GLUCAP in the last 168 hours. Lipid Profile: No results for input(s): CHOL, HDL, LDLCALC, TRIG, CHOLHDL, LDLDIRECT in the last 72 hours. Thyroid Function Tests:  Recent Labs  01/04/16 1525  TSH 2.727   Anemia Panel: No results for input(s): VITAMINB12, FOLATE, FERRITIN, TIBC, IRON, RETICCTPCT in the last 72 hours. Urine analysis: No results found for: COLORURINE, APPEARANCEUR, LABSPEC, PHURINE, GLUCOSEU, HGBUR, BILIRUBINUR, KETONESUR, PROTEINUR, UROBILINOGEN, NITRITE, LEUKOCYTESUR Sepsis Labs: @LABRCNTIP (procalcitonin:4,lacticidven:4)  )No results found for this or any previous visit (from the past 240 hour(s)).       Radiology Studies: No results found.      Scheduled Meds: . amitriptyline  50 mg Oral QHS  . citalopram  40 mg Oral Daily  . famotidine (PEPCID) IV  20 mg Intravenous Q12H  . levothyroxine  112 mcg Oral QAC breakfast  . metoprolol tartrate  25 mg Oral BID  . simvastatin  20 mg Oral q1800   Continuous Infusions: . sodium chloride       LOS: 1 day    Time spent: Greater than 35  Minutes.    Berton Mount, MD  [facility] Pager #: [phone] 7PM-7AM contact night coverage as above   Called by patient's Nurse informing that patient's SBP is -Will transfer patient to Step down unit -Will transfuse 2 units of PRBC -Will give of normal saline and reassess - GI is following patient and has colonoscopy scheduled within the hour - Low threshold to consult the Surgery team as well depending on the colonscopic finding.  Discussed above with the patient's Nurse, Kiristin.

## 2016-01-05 NOTE — Progress Notes (Addendum)
Pt's BP low 87/48 and P101. Patient is newly confused. Notified MD. New orders written. [facility]

## 2016-01-05 NOTE — Op Note (Signed)
[facility] Patient Name: Diane Hernandez Procedure Date: 01/05/2016 MRN: [id number] Attending MD: Carol Ada , MD Date of Birth: 02-10-1946 CSN: [id number] Age: 71 Admit Type: Inpatient Procedure:                Flexible Sigmoidoscopy Indications:              Hematochezia Providers:                Carol Ada, MD, Dustin Flock, RN, William Dalton, Technician Referring MD:              Medicines:                Fentanyl 25 micrograms IV, Midazolam 4 mg IV Complications:            No immediate complications. Estimated Blood Loss:     Estimated blood loss: none. Procedure:                Pre-Anesthesia Assessment:                           - Prior to the procedure, a History and Physical                            was performed, and patient medications and                            allergies were reviewed. The patient's tolerance of                            previous anesthesia was also reviewed. The risks                            and benefits of the procedure and the sedation                            options and risks were discussed with the patient.                            All questions were answered, and informed consent                            was obtained. Prior Anticoagulants: The patient has                            taken no previous anticoagulant or antiplatelet                            agents. ASA Grade Assessment: III - A patient with                            severe systemic disease. After reviewing the risks  and benefits, the patient was deemed in                            satisfactory condition to undergo the procedure.                           - Sedation was administered by an endoscopy nurse.                            The sedation level attained was moderate.                           After obtaining informed consent, the scope was                            passed under direct  vision. The EC-[id number] ([id number])                            scope was introduced through the anus and advanced                            to the the sigmoid colon. After obtaining informed                            consent, the scope was passed under direct vision.                            The flexible sigmoidoscopy was performed with                            difficulty due to inadequate bowel prep. The                            patient tolerated the procedure well. The quality                            of the bowel preparation was poor. Scope In: Scope Out: Findings:      A large amount of stool was found in the rectum and in the sigmoid       colon, precluding visualization. Manual disimpaction.      The colonoscope was inserted and it was immediately met with solid       stool. The colonoscope was maneuvered to see if it could move beyond the       stool, but this was not successful. There was NO evidence of blood       coating the stool or mixed with the stool. The scope was withdrawn and I       performed a manual disimpaction. The rectum was cleared and the scope       was reinserted and advanced to the sigmoid colon, where solid stool was       again encountered. Again, NO evidence of any stool. The mucosa of the       sigmoid colon and rectum were normal. There was no evidence of any       inflammation  or solitary rectal ulcer to explain the bleeding. My       suspicion is that her bleeding was secondary to her hemorrhoids and       there were some suspicious external hemorrhoid bleeding sites. I spoke       with the floor nurse who confirmed the hematochezia as it was dripping       down her leg and she was not able to make it to the restroom at times. Impression:               - Preparation of the colon was poor.                           - Stool in the rectum and in the sigmoid colon.                           - No specimens collected. Moderate Sedation:      Moderate  (conscious) sedation was administered by the endoscopy nurse       and supervised by the endoscopist. The following parameters were       monitored: oxygen saturation, heart rate, blood pressure, and response       to care. Recommendation:           - Return patient to ICU for ongoing care.                           - Clear liquid diet.                           - If hematochezia recurs, apply an ice pack to the                            anus. Procedure Code(s):        --- Professional ---                           [phone], Sigmoidoscopy, flexible; diagnostic,                            including collection of specimen(s) by brushing or                            washing, when performed (separate procedure) Diagnosis Code(s):        --- Professional ---                           K92.1, Melena (includes Hematochezia) CPT copyright 2016 American Medical Association. All rights reserved. The codes documented in this report are preliminary and upon coder review may  be revised to meet current compliance requirements. Carol Ada, MD Carol Ada, MD 01/05/2016 5:30:51 PM This report has been signed electronically. Number of Addenda: 0

## 2016-01-05 NOTE — Progress Notes (Signed)
PT Cancellation Note  Patient Details Name: Diane DallasBrenda Hernandez MRN: [id number] DOB: 06-12-1945   Cancelled Treatment:    Reason Eval/Treat Not Completed: Patient at procedure or test/unavailable . Pt prepping for colonoscopy. Will hold PT at this time and check back another time/day. Thanks.    Rebeca AlertJannie Yajahira Tison, MPT Pager: [phone]

## 2016-01-05 NOTE — Op Note (Signed)
[facility] Patient Name: Diane DallasBrenda Rostron Procedure Date: 01/05/2016 MRN: [id number] Attending MD: Jeani HawkingPatrick Darrian Grzelak , MD Date of Birth: 07-Mar-1946 CSN: [id number] Age: [age] Admit Type: Inpatient Procedure:                Upper GI endoscopy Indications:              Hematochezia, hypotension Providers:                Jeani HawkingPatrick Vinicio Lynk, MD, Waynard EdwardsMegan Oliver, RN, Kandice RobinsonsGuillaume                            Awaka, Technician Referring MD:              Medicines:                See the FFS report. Complications:            No immediate complications. Estimated Blood Loss:     Estimated blood loss: none. Procedure:                Pre-Anesthesia Assessment:                           - Prior to the procedure, a History and Physical                            was performed, and patient medications and                            allergies were reviewed. The patient's tolerance of                            previous anesthesia was also reviewed. The risks                            and benefits of the procedure and the sedation                            options and risks were discussed with the patient.                            All questions were answered, and informed consent                            was obtained. Prior Anticoagulants: The patient has                            taken no previous anticoagulant or antiplatelet                            agents. ASA Grade Assessment: III - A patient with                            severe systemic disease. After reviewing the risks  and benefits, the patient was deemed in                            satisfactory condition to undergo the procedure.                           - Sedation was administered by an endoscopy nurse.                            The sedation level attained was moderate.                           After obtaining informed consent, the endoscope was                            passed under direct vision.  Throughout the                            procedure, the patient's blood pressure, pulse, and                            oxygen saturations were monitored continuously. The                            EC-3490LI ([id number]) scope was introduced through                            the mouth, and advanced to the third part of                            duodenum. The upper GI endoscopy was accomplished                            without difficulty. The patient tolerated the                            procedure well. Scope In: Scope Out: Findings:      Fluid was found in the entire esophagus. aggressive suctioning.      A 7 cm hiatal hernia was present.      The stomach was normal.      The examined duodenum was normal.      Upon initial intubation of the esophagus a large column of water was       encountered. Immediate suctioning was performed and it was clear that       her esophagus was completely filled with clear fluid, most likely the       Miralax prep. As the water column dissapated the scope was advanced into       the gastric lumen, which was markedly distended with clear fluid. All of       this fluid was suctioned and a total of 500 ml of fluid was suctioned.       Closer examination revealed that the majority of the fluid was trapped       in hiatal hernia, possibly a paraesophageal hiatal hernia, with backflow  in to the esophagus. The hiatal hernia measured 6-7 cm. The endoscopy       was pursued to rule out an upper GI source of bleeding with the witness       hematochezia and hypotension. There was no evidence of any bleeding       source in the upper GI tract. In retrospect, it is likely that her       hypotension was secondary to the distension of the hiatal hernia sac       from the Miralax prep. Her blood pressure was in the 130's mmHg post       procedure. Impression:               - Fluid in the esophagus.                           - 7 cm hiatal hernia.                            - Normal stomach.                           - Normal examined duodenum.                           - No specimens collected. Moderate Sedation:      Moderate (conscious) sedation was administered by the endoscopy nurse       and supervised by the endoscopist. The following parameters were       monitored: oxygen saturation, heart rate, blood pressure, and response       to care. Recommendation:           - Return patient to ICU for ongoing care.                           - Clear liquid diet.                           - Continue present medications.                           - Esophagram. Procedure Code(s):        --- Professional ---                           [phone], Esophagogastroduodenoscopy, flexible,                            transoral; diagnostic, including collection of                            specimen(s) by brushing or washing, when performed                            (separate procedure) Diagnosis Code(s):        --- Professional ---                           K44.9, Diaphragmatic hernia without obstruction or  gangrene                           K92.1, Melena (includes Hematochezia) CPT copyright 2016 American Medical Association. All rights reserved. The codes documented in this report are preliminary and upon coder review may  be revised to meet current compliance requirements. Jeani Hawking, MD Jeani Hawking, MD 01/05/2016 5:41:01 PM This report has been signed electronically. Number of Addenda: 0

## 2016-01-06 ENCOUNTER — Encounter (HOSPITAL_COMMUNITY): Payer: Self-pay | Admitting: Gastroenterology

## 2016-01-06 DIAGNOSIS — K922 Gastrointestinal hemorrhage, unspecified: Principal | ICD-10-CM

## 2016-01-06 LAB — RENAL FUNCTION PANEL
Albumin: 3.1 g/dL — ABNORMAL LOW (ref 3.5–5.0)
Anion gap: 8 (ref 5–15)
BUN: 30 mg/dL — AB (ref 6–20)
CHLORIDE: 104 mmol/L (ref 101–111)
CO2: 19 mmol/L — AB (ref 22–32)
CREATININE: 1.35 mg/dL — AB (ref 0.44–1.00)
Calcium: 8.2 mg/dL — ABNORMAL LOW (ref 8.9–10.3)
GFR calc Af Amer: 46 mL/min — ABNORMAL LOW (ref >60)
GFR, EST NON AFRICAN AMERICAN: 39 mL/min — AB (ref >60)
Glucose, Bld: 107 mg/dL — ABNORMAL HIGH (ref 65–99)
POTASSIUM: 4.3 mmol/L (ref 3.5–5.1)
Phosphorus: 3.1 mg/dL (ref 2.5–4.6)
Sodium: 131 mmol/L — ABNORMAL LOW (ref 135–145)

## 2016-01-06 LAB — TYPE AND SCREEN
ABO/RH(D): A POS
Antibody Screen: NEGATIVE
Unit division: 0
Unit division: 0

## 2016-01-06 LAB — PHOSPHORUS: PHOSPHORUS: 3.1 mg/dL (ref 2.5–4.6)

## 2016-01-06 LAB — SODIUM, URINE, RANDOM: SODIUM UR: 17 mmol/L

## 2016-01-06 LAB — COMPREHENSIVE METABOLIC PANEL
ALK PHOS: 59 U/L (ref 38–126)
ALT: 19 U/L (ref 14–54)
AST: 39 U/L (ref 15–41)
Albumin: 3.1 g/dL — ABNORMAL LOW (ref 3.5–5.0)
Anion gap: 8 (ref 5–15)
BILIRUBIN TOTAL: 1.1 mg/dL (ref 0.3–1.2)
BUN: 31 mg/dL — AB (ref 6–20)
CO2: 19 mmol/L — AB (ref 22–32)
Calcium: 8.2 mg/dL — ABNORMAL LOW (ref 8.9–10.3)
Chloride: 104 mmol/L (ref 101–111)
Creatinine, Ser: 1.34 mg/dL — ABNORMAL HIGH (ref 0.44–1.00)
GFR calc non Af Amer: 40 mL/min — ABNORMAL LOW (ref >60)
GFR, EST AFRICAN AMERICAN: 46 mL/min — AB (ref >60)
GLUCOSE: 106 mg/dL — AB (ref 65–99)
Potassium: 4.3 mmol/L (ref 3.5–5.1)
SODIUM: 131 mmol/L — AB (ref 135–145)
TOTAL PROTEIN: 6 g/dL — AB (ref 6.5–8.1)

## 2016-01-06 LAB — HEMOGLOBIN AND HEMATOCRIT, BLOOD
HCT: 26.4 % — ABNORMAL LOW (ref 36.0–46.0)
Hemoglobin: 8.9 g/dL — ABNORMAL LOW (ref 12.0–15.0)

## 2016-01-06 LAB — CBC WITH DIFFERENTIAL/PLATELET
BASOS ABS: 0 10*3/uL (ref 0.0–0.1)
BASOS PCT: 0 %
EOS ABS: 0.2 10*3/uL (ref 0.0–0.7)
Eosinophils Relative: 1 %
HCT: 26.3 % — ABNORMAL LOW (ref 36.0–46.0)
HEMOGLOBIN: 9 g/dL — AB (ref 12.0–15.0)
Lymphocytes Relative: 9 %
Lymphs Abs: 1.1 10*3/uL (ref 0.7–4.0)
MCH: 28.1 pg (ref 26.0–34.0)
MCHC: 34.2 g/dL (ref 30.0–36.0)
MCV: 82.2 fL (ref 78.0–100.0)
MONO ABS: 1.1 10*3/uL — AB (ref 0.1–1.0)
MONOS PCT: 10 %
NEUTROS PCT: 80 %
Neutro Abs: 8.8 10*3/uL — ABNORMAL HIGH (ref 1.7–7.7)
Platelets: 242 10*3/uL (ref 150–400)
RBC: 3.2 MIL/uL — ABNORMAL LOW (ref 3.87–5.11)
RDW: 15.3 % (ref 11.5–15.5)
WBC: 11.2 10*3/uL — ABNORMAL HIGH (ref 4.0–10.5)

## 2016-01-06 LAB — CREATININE, URINE, RANDOM: Creatinine, Urine: 107.64 mg/dL

## 2016-01-06 LAB — OSMOLALITY, URINE: Osmolality, Ur: 296 mOsm/kg — ABNORMAL LOW (ref 300–900)

## 2016-01-06 LAB — MAGNESIUM: MAGNESIUM: 1.8 mg/dL (ref 1.7–2.4)

## 2016-01-06 MED ORDER — FLUCONAZOLE 150 MG PO TABS
150.0000 mg | ORAL_TABLET | Freq: Once | ORAL | Status: AC
  Administered 2016-01-06: 150 mg via ORAL
  Filled 2016-01-06: qty 1

## 2016-01-06 MED ORDER — LORAZEPAM 2 MG/ML IJ SOLN
INTRAMUSCULAR | Status: AC
  Administered 2016-01-06: 2 mg
  Filled 2016-01-06: qty 1

## 2016-01-06 MED ORDER — LORAZEPAM 2 MG/ML IJ SOLN
1.0000 mg | Freq: Four times a day (QID) | INTRAMUSCULAR | Status: DC
  Administered 2016-01-06: 1 mg via INTRAVENOUS
  Filled 2016-01-06: qty 1
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
## 2016-01-06 NOTE — Progress Notes (Signed)
CT contacted RN regarding need for DG of the esophagus. RN will address order with Dr. Elnoria HowardHung, GI MD, when he rounds. Will continue to monitor.

## 2016-01-06 NOTE — Progress Notes (Signed)
Pt husband, Deniece PortelaWayne, requested that no medical information be shared with pt son, Pete GlatterWayne Jr. RN requested that pt husband set up pt password, but husband denied. Will pass on to following shift RN.

## 2016-01-06 NOTE — Evaluation (Signed)
Physical Therapy Evaluation Patient Details Name: Diane DallasBrenda Mccleary MRN: [id number] DOB: 13-Sep-1945 Today's Date: 01/06/2016   History of Present Illness  Diane Hernandez is a 70 year old, morbidly white female who was transferred to [facility] after she presented there with a three-day history of rectal bleeding; Hx: HTN, depression  Clinical Impression  Pt admitted with above diagnosis. Pt currently with functional limitations due to the deficits listed below (see PT Problem List). * Pt will benefit from skilled PT to increase their independence and safety with mobility to allow discharge to the venue listed below.  Pt confused throughout PT eval, VSS although pt with incr WOB--unsure of her baseline activity level will continue and assess for needs     Follow Up Recommendations Home health PT;SNF (depending on cognition/home support/LOS)    Equipment Recommendations  Other (comment) (TBA)    Recommendations for Other Services       Precautions / Restrictions Precautions Precautions: Fall Restrictions Weight Bearing Restrictions: No      Mobility  Bed Mobility Overal bed mobility: Needs Assistance;+2 for physical assistance;+ 2 for safety/equipment Bed Mobility: Supine to Sit;Sit to Supine     Supine to sit: Max assist;+2 for safety/equipment;+2 for physical assistance Sit to supine: Max assist;+2 for safety/equipment;+2 for physical assistance   General bed mobility comments: assist for trunk and LEs, pt with difficulty following multi-modla cues/commands ( previously requesting to get up per staff)  Transfers Overall transfer level: Needs assistance Equipment used: 2 person hand held assist Transfers: Sit to/from Stand Sit to Stand: Mod assist         General transfer comment: assist to rise, pt able to support her on wt well once in full standing position  Ambulation/Gait Ambulation/Gait assistance: Mod assist;Max assist Ambulation Distance  (Feet): 30 Feet Assistive device: 2 person hand held assist Gait Pattern/deviations: Step-through pattern;Decreased stride length;Drifts right/left;Wide base of support     General Gait Details: assist for balance and wt shift to initiate steps  Stairs            Wheelchair Mobility    Modified Rankin (Stroke Patients Only)       Balance Overall balance assessment: Needs assistance         Standing balance support: During functional activity;Bilateral upper extremity supported Standing balance-Leahy Scale: Poor Standing balance comment: unsteady in standing without external support                              Pertinent Vitals/Pain Pain Assessment: No/denies pain    Home Living Family/patient expects to be discharged to:: Private residence Living Arrangements: Spouse/significant other Available Help at Discharge: Family             Additional Comments: pt unable to give hx secondary to confusion    Prior Function Level of Independence: Independent               Hand Dominance        Extremity/Trunk Assessment   Upper Extremity Assessment: Defer to OT evaluation           Lower Extremity Assessment: Difficult to assess due to impaired cognition         Communication   Communication: Other (comment) (confused)  Cognition Arousal/Alertness: Awake/alert Behavior During Therapy: Restless Overall Cognitive Status: Impaired/Different from baseline Area of Impairment: Orientation;Following commands;Safety/judgement Orientation Level: Disoriented to;Place;Time;Situation     Following Commands: Follows one step commands inconsistently Safety/Judgement: Decreased  awareness of safety;Decreased awareness of deficits          General Comments      Exercises        Assessment/Plan    PT Assessment Patient needs continued PT services  PT Diagnosis Difficulty walking;Altered mental status   PT Problem List Decreased  activity tolerance;Decreased balance;Decreased knowledge of use of DME;Decreased mobility;Decreased cognition;Decreased safety awareness  PT Treatment Interventions DME instruction;Gait training;Functional mobility training;Therapeutic exercise;Therapeutic activities   PT Goals (Current goals can be found in the Care Plan section) Acute Rehab PT Goals Patient Stated Goal: unable to state PT Goal Formulation: Patient unable to participate in goal setting Time For Goal Achievement: 01/20/16 Potential to Achieve Goals: Good    Frequency Min 3X/week   Barriers to discharge        Co-evaluation               End of Session Equipment Utilized During Treatment: Gait belt Activity Tolerance: Treatment limited secondary to agitation Patient left: with call bell/phone within reach;in bed;with bed alarm set;with family/visitor present;with nursing/sitter in room           Time: 1320-1347 PT Time Calculation (min) (ACUTE ONLY): 27 min   Charges:   PT Evaluation $PT Eval Moderate Complexity: 1 Procedure PT Treatments $Gait Training: 8-22 mins   PT G Codes:        Diane Hernandez 01/19/16, 2:28 PM

## 2016-01-07 ENCOUNTER — Inpatient Hospital Stay (HOSPITAL_COMMUNITY): Payer: Medicare Other

## 2016-01-07 DIAGNOSIS — I10 Essential (primary) hypertension: Secondary | ICD-10-CM

## 2016-01-07 DIAGNOSIS — R410 Disorientation, unspecified: Secondary | ICD-10-CM

## 2016-01-07 LAB — AMMONIA: AMMONIA: 13 umol/L (ref 9–35)

## 2016-01-07 LAB — URINALYSIS, ROUTINE W REFLEX MICROSCOPIC
BILIRUBIN URINE: NEGATIVE
GLUCOSE, UA: NEGATIVE mg/dL
KETONES UR: 40 mg/dL — AB
Nitrite: POSITIVE — AB
PROTEIN: 30 mg/dL — AB
Specific Gravity, Urine: 1.014 (ref 1.005–1.030)
pH: 5.5 (ref 5.0–8.0)

## 2016-01-07 LAB — FOLATE: Folate: 59.1 ng/mL (ref >5.9)

## 2016-01-07 LAB — URINE MICROSCOPIC-ADD ON

## 2016-01-07 LAB — CBC
HCT: 25.6 % — ABNORMAL LOW (ref 36.0–46.0)
HEMOGLOBIN: 8.6 g/dL — AB (ref 12.0–15.0)
MCH: 28.1 pg (ref 26.0–34.0)
MCHC: 33.6 g/dL (ref 30.0–36.0)
MCV: 83.7 fL (ref 78.0–100.0)
Platelets: 255 10*3/uL (ref 150–400)
RBC: 3.06 MIL/uL — AB (ref 3.87–5.11)
RDW: 15.6 % — ABNORMAL HIGH (ref 11.5–15.5)
WBC: 11.8 10*3/uL — ABNORMAL HIGH (ref 4.0–10.5)

## 2016-01-07 LAB — VITAMIN B12: Vitamin B-12: 1986 pg/mL — ABNORMAL HIGH (ref 180–914)

## 2016-01-07 MED ORDER — CITALOPRAM HYDROBROMIDE 20 MG PO TABS
20.0000 mg | ORAL_TABLET | Freq: Every day | ORAL | Status: DC
  Administered 2016-01-07 – 2016-01-09 (×3): 20 mg via ORAL
  Filled 2016-01-07 (×3): qty 1

## 2016-01-07 MED ORDER — ALPRAZOLAM 0.25 MG PO TABS
0.2500 mg | ORAL_TABLET | Freq: Four times a day (QID) | ORAL | Status: DC
  Administered 2016-01-07 – 2016-01-09 (×7): 0.25 mg via ORAL
  Filled 2016-01-07 (×8): qty 1

## 2016-01-07 MED ORDER — ALBUTEROL SULFATE (2.5 MG/3ML) 0.083% IN NEBU
2.5000 mg | INHALATION_SOLUTION | Freq: Four times a day (QID) | RESPIRATORY_TRACT | Status: DC | PRN
  Administered 2016-01-08: 2.5 mg via RESPIRATORY_TRACT
  Filled 2016-01-07: qty 3

## 2016-01-07 MED ORDER — IOPAMIDOL (ISOVUE-300) INJECTION 61%: 80.0000 mL | Freq: Once | INTRAVENOUS | Status: DC | PRN

## 2016-01-07 MED ORDER — ALPRAZOLAM 0.25 MG PO TABS
0.2500 mg | ORAL_TABLET | Freq: Four times a day (QID) | ORAL | Status: DC | PRN
  Administered 2016-01-07: 0.25 mg via ORAL
  Filled 2016-01-07: qty 1

## 2016-01-07 MED ORDER — CIPROFLOXACIN IN D5W 400 MG/200ML IV SOLN
400.0000 mg | Freq: Two times a day (BID) | INTRAVENOUS | Status: DC
  Administered 2016-01-07 – 2016-01-09 (×4): 400 mg via INTRAVENOUS
  Filled 2016-01-07 (×4): qty 200

## 2016-01-07 MED ORDER — IOPAMIDOL (ISOVUE-370) INJECTION 76%: 100.0000 mL | Freq: Once | INTRAVENOUS | Status: DC | PRN

## 2016-01-07 NOTE — Consult Note (Signed)
[facility] Consult   Reason for Consult:  Delirium Referring Physician:  Dr. Wendee Beavers  Patient Identification: Diane Hernandez MRN:  [id number] Principal Diagnosis: Delirium  Diagnosis:   Patient Active Problem List   Diagnosis Date Noted  . GI bleed [K92.2] 01/04/2016  . Hypothyroidism [E03.9] 01/04/2016  . HTN (hypertension) [I10] 01/04/2016  . Depression [F32.9] 01/04/2016  . HLD (hyperlipidemia) [E78.5] 01/04/2016    Total Time spent with patient: 30 minutes  Subjective:   Tashana Haberl is a 70 y.o. female patient admitted with rectal bleeding .  HPI:  Patient is a 70 year old female , presented to ED  On 8/28 due to rectal bleeding x 3 days . A Flexible Sigmoidoscopy was done, but no specific finding , large amount of stool in sigmoid colon made visualization difficult . Received transfusion for anemia ( 2 units of PRBCs) As per chart notes, was alert, attentive on admission . On 8/29 patient developed confusion , disorientation, and agitation , requiring safety sitter, due to trying to get out of bed and pulling IV/monitor leads  At this time patient is sedated , unable to participate in conversation or provide any verbal  information . Briefly opens her eyes when name called loudly , and mumbles unintelligibly at times . No tremors, no  agitation, restlessness, or combativeness at this time- as per staff report was agitated yesterday evening . As per chart notes, it is felt that BZD withdrawal could be contributing to presentation ,  I have spoken with Dr. Wendee Beavers, Attending , who has spoken with husband- patient has been taking Xanax for a long period of time , up to admission. Labs are remarkable for leukocytosis 11.2, and elevated ABS Neutrophilxs ( 8.8) , Anemia ( Hgb 9- improved compared to admission ) , Hyponatremia ( 131) , Elevated Creatinine / BUN ( 1.34/ 30- stable compared to prior) , Lactic Acid WNL ( 1.7) on 8/29         Past Psychiatric History: patient  unable to provide information at this time- based on chart - history of depression . Home medications list Celexa  40 mgrs QDAY, Elavil 50 mgrs QHS, and Xanax 0.5 mgrs TID PRN  for anxiety   Risk to Self: Is patient at risk for suicide?: No Risk to Others:   Prior Inpatient Therapy:   Prior Outpatient Therapy:    Past Medical History: History reviewed. No pertinent past medical history.  Past Surgical History:  Procedure Laterality Date  . ESOPHAGOGASTRODUODENOSCOPY N/A 01/05/2016   Procedure: ESOPHAGOGASTRODUODENOSCOPY (EGD);  Surgeon: Carol Ada, MD;  Location: [facility];  Service: Endoscopy;  Laterality: N/A;  . FLEXIBLE SIGMOIDOSCOPY N/A 01/05/2016   Procedure: FLEXIBLE SIGMOIDOSCOPY;  Surgeon: Carol Ada, MD;  Location: [facility];  Service: Endoscopy;  Laterality: N/A;   Family History: History reviewed. No pertinent family history. Family Psychiatric  History: no information obtained at this time  Social History: married  History  Alcohol use Not on file     History  Drug use: Unknown    Social History   Social History  . Marital status: Married    Spouse name: N/A  . Number of children: N/A  . Years of education: N/A   Social History Main Topics  . Smoking status: Passive Smoke Exposure - Never Smoker  . Smokeless tobacco: Never Used  . Alcohol use None  . Drug use: Unknown  . Sexual activity: Not Asked   Other Topics Concern  . None   Social History  Narrative  . None   Additional Social History:    Allergies:   Allergies  Allergen Reactions  . Omnicef [Cefdinir] Diarrhea  . Penicillins Hives    Has patient had a PCN reaction causing immediate rash, facial/tongue/throat swelling, SOB or lightheadedness with hypotension: No Has patient had a PCN reaction causing severe rash involving mucus membranes or skin necrosis: Yes Has patient had a PCN reaction that required hospitalization No Has patient had a PCN reaction occurring within the last 10  years: No If all of the above answers are "NO", then may proceed with Cephalosporin use.   . Prednisone Other (See Comments)    Makes very jittery     Labs:  Results for orders placed or performed during the hospital encounter of 01/04/16 (from the past 48 hour(s))  Lactic acid, plasma     Status: None   Collection Time: 01/05/16  9:58 AM  Result Value Ref Range   Lactic Acid, Venous 1.1 0.5 - 1.9 mmol/L  Osmolality     Status: None   Collection Time: 01/05/16  9:58 AM  Result Value Ref Range   Osmolality 282 275 - 295 mOsm/kg    Comment: REPEATED TO VERIFY Performed at [facility]   Lactic acid, plasma     Status: None   Collection Time: 01/05/16  1:12 PM  Result Value Ref Range   Lactic Acid, Venous 1.7 0.5 - 1.9 mmol/L  Prepare RBC     Status: None   Collection Time: 01/05/16  2:20 PM  Result Value Ref Range   Order Confirmation ORDER PROCESSED BY BLOOD BANK   Hemoglobin and hematocrit, blood     Status: Abnormal   Collection Time: 01/05/16  2:26 PM  Result Value Ref Range   Hemoglobin 7.8 (L) 12.0 - 15.0 g/dL   HCT 22.7 (L) 36.0 - 46.0 %  Type and screen [place]     Status: None   Collection Time: 01/05/16  2:26 PM  Result Value Ref Range   ABO/RH(D) A POS    Antibody Screen NEG    Sample Expiration 01/08/2016    Unit Number [id number]    Blood Component Type RED CELLS,LR    Unit division 00    Status of Unit ISSUED,FINAL    Transfusion Status OK TO TRANSFUSE    Crossmatch Result Compatible    Unit Number [id number]    Blood Component Type RED CELLS,LR    Unit division 00    Status of Unit ISSUED,FINAL    Transfusion Status OK TO TRANSFUSE    Crossmatch Result Compatible   ABO/Rh     Status: None   Collection Time: 01/05/16  2:26 PM  Result Value Ref Range   ABO/RH(D) A POS   MRSA PCR Screening     Status: Abnormal   Collection Time: 01/05/16  3:32 PM  Result Value Ref Range   MRSA by PCR POSITIVE (A) NEGATIVE     Comment:        The GeneXpert MRSA Assay (FDA approved for NASAL specimens only), is one component of a comprehensive MRSA colonization surveillance program. It is not intended to diagnose MRSA infection nor to guide or monitor treatment for MRSA infections. RESULT CALLED TO, READ BACK BY AND VERIFIED WITH: [facility] [id number] @ [id number] BY J SCOTTON   Sodium, urine, random     Status: None   Collection Time: 01/05/16 11:27 PM  Result Value Ref Range   Sodium, Ur 17 mmol/L  Comment: Performed at [facility]  Creatinine, urine, random     Status: None   Collection Time: 01/05/16 11:27 PM  Result Value Ref Range   Creatinine, Urine 107.64 mg/dL    Comment: Performed at [facility]  Osmolality, urine     Status: Abnormal   Collection Time: 01/05/16 11:27 PM  Result Value Ref Range   Osmolality, Ur 296 (L) 300 - 900 mOsm/kg    Comment: Performed at [facility]  Renal function panel     Status: Abnormal   Collection Time: 01/06/16  2:15 AM  Result Value Ref Range   Sodium 131 (L) 135 - 145 mmol/L   Potassium 4.3 3.5 - 5.1 mmol/L   Chloride 104 101 - 111 mmol/L   CO2 19 (L) 22 - 32 mmol/L   Glucose, Bld 107 (H) 65 - 99 mg/dL   BUN 30 (H) 6 - 20 mg/dL   Creatinine, Ser 1.35 (H) 0.44 - 1.00 mg/dL   Calcium 8.2 (L) 8.9 - 10.3 mg/dL   Phosphorus 3.1 2.5 - 4.6 mg/dL   Albumin 3.1 (L) 3.5 - 5.0 g/dL   GFR calc non Af Amer 39 (L) >60 mL/min   GFR calc Af Amer 46 (L) >60 mL/min    Comment: (NOTE) The eGFR has been calculated using the CKD EPI equation. This calculation has not been validated in all clinical situations. eGFR's persistently <60 mL/min signify possible Chronic Kidney Disease.    Anion gap 8 5 - 15  Comprehensive metabolic panel     Status: Abnormal   Collection Time: 01/06/16  2:15 AM  Result Value Ref Range   Sodium 131 (L) 135 - 145 mmol/L   Potassium 4.3 3.5 - 5.1 mmol/L   Chloride 104 101 - 111 mmol/L   CO2 19 (L) 22 - 32 mmol/L    Glucose, Bld 106 (H) 65 - 99 mg/dL   BUN 31 (H) 6 - 20 mg/dL   Creatinine, Ser 1.34 (H) 0.44 - 1.00 mg/dL   Calcium 8.2 (L) 8.9 - 10.3 mg/dL   Total Protein 6.0 (L) 6.5 - 8.1 g/dL   Albumin 3.1 (L) 3.5 - 5.0 g/dL   AST 39 15 - 41 U/L   ALT 19 14 - 54 U/L   Alkaline Phosphatase 59 38 - 126 U/L   Total Bilirubin 1.1 0.3 - 1.2 mg/dL   GFR calc non Af Amer 40 (L) >60 mL/min   GFR calc Af Amer 46 (L) >60 mL/min    Comment: (NOTE) The eGFR has been calculated using the CKD EPI equation. This calculation has not been validated in all clinical situations. eGFR's persistently <60 mL/min signify possible Chronic Kidney Disease.    Anion gap 8 5 - 15  Magnesium     Status: None   Collection Time: 01/06/16  2:15 AM  Result Value Ref Range   Magnesium 1.8 1.7 - 2.4 mg/dL  Phosphorus     Status: None   Collection Time: 01/06/16  2:15 AM  Result Value Ref Range   Phosphorus 3.1 2.5 - 4.6 mg/dL  CBC with Differential/Platelet     Status: Abnormal   Collection Time: 01/06/16  2:15 AM  Result Value Ref Range   WBC 11.2 (H) 4.0 - 10.5 K/uL   RBC 3.20 (L) 3.87 - 5.11 MIL/uL   Hemoglobin 9.0 (L) 12.0 - 15.0 g/dL   HCT 26.3 (L) 36.0 - 46.0 %   MCV 82.2 78.0 - 100.0 fL   MCH  28.1 26.0 - 34.0 pg   MCHC 34.2 30.0 - 36.0 g/dL   RDW 15.3 11.5 - 15.5 %   Platelets 242 150 - 400 K/uL   Neutrophils Relative % 80 %   Neutro Abs 8.8 (H) 1.7 - 7.7 K/uL   Lymphocytes Relative 9 %   Lymphs Abs 1.1 0.7 - 4.0 K/uL   Monocytes Relative 10 %   Monocytes Absolute 1.1 (H) 0.1 - 1.0 K/uL   Eosinophils Relative 1 %   Eosinophils Absolute 0.2 0.0 - 0.7 K/uL   Basophils Relative 0 %   Basophils Absolute 0.0 0.0 - 0.1 K/uL  Hemoglobin and hematocrit, blood     Status: Abnormal   Collection Time: 01/06/16  8:23 AM  Result Value Ref Range   Hemoglobin 8.9 (L) 12.0 - 15.0 g/dL   HCT 26.4 (L) 36.0 - 46.0 %    Current Facility-Administered Medications  Medication Dose Route Frequency Provider Last Rate Last  Dose  . 0.9 %  sodium chloride infusion   Intravenous Continuous Bonnell Public, MD 100 mL/hr at 01/07/16 0400    . albuterol (PROVENTIL) (2.5 MG/3ML) 0.083% nebulizer solution 2.5 mg  2.5 mg Nebulization Q6H PRN Velvet Bathe, MD      . ALPRAZolam Duanne Moron) tablet 0.5 mg  0.5 mg Oral Q6H PRN Caren Griffins, MD   0.5 mg at 01/06/16 1239  . amitriptyline (ELAVIL) tablet 50 mg  50 mg Oral QHS Caren Griffins, MD   50 mg at 01/06/16 2128  . Chlorhexidine Gluconate Cloth 2 % PADS 6 each  6 each Topical Q0600 Bonnell Public, MD   6 each at 01/07/16 0600  . citalopram (CELEXA) tablet 40 mg  40 mg Oral Daily Caren Griffins, MD   40 mg at 01/06/16 0946  . famotidine (PEPCID) IVPB 20 mg premix  20 mg Intravenous Q12H Bonnell Public, MD   20 mg at 01/06/16 2132  . levothyroxine (SYNTHROID, LEVOTHROID) tablet 112 mcg  112 mcg Oral QAC breakfast Caren Griffins, MD   112 mcg at 01/07/16 0175  . MEDLINE mouth rinse  15 mL Mouth Rinse BID Bonnell Public, MD   15 mL at 01/06/16 2200  . metoprolol tartrate (LOPRESSOR) tablet 12.5 mg  12.5 mg Oral BID Bonnell Public, MD   12.5 mg at 01/06/16 0945  . mupirocin ointment (BACTROBAN) 2 % 1 application  1 application Nasal BID Bonnell Public, MD   1 application at 03/02/84 2133  . ondansetron (ZOFRAN) tablet 4 mg  4 mg Oral Q6H PRN Costin Karlyne Greenspan, MD       Or  . ondansetron (ZOFRAN) injection 4 mg  4 mg Intravenous Q6H PRN Caren Griffins, MD   4 mg at 01/04/16 1706  . simvastatin (ZOCOR) tablet 20 mg  20 mg Oral q1800 Caren Griffins, MD   20 mg at 01/06/16 1749    Musculoskeletal: Strength & Muscle Tone: within normal limits Gait & Station: unable to stand Patient leans: N/A  Psychiatric Specialty Exam: Physical Exam  ROS unable to obtain   Blood pressure (!) 124/41, pulse (!) 108, temperature 98.8 F (37.1 C), temperature source Oral, resp. rate (!) 22, height _0  (1.626 m), weight 227 lb 8.2 oz (103.2 kg), SpO2 100 %.Body  mass index is 39.05 kg/m.  General Appearance: Fairly Groomed in hospital garb  Patient sedated,sleeping - opens eyes briefly when name called , mumbles very briefly, unintelligibly at times  Eye Contact:  None  Speech:  None , mumbles briefly, unintelligibly at times   Volume:  NA  Mood:  unable to assess   Affect:  blunt  Thought Process: unable to assess   Orientation:  Drowsy, sedated   Thought Content:  unable to assess   Suicidal Thoughts:  unable to assess   Homicidal Thoughts:  unable to assess   Memory:  NA  Judgement:  NA  Insight:  NA  Psychomotor Activity:  at this time not tremulous or diaphoretic, not agitated or in any acute distress   Concentration:  Concentration: Poor and Attention Span: Fair  Recall:  Poor  Fund of Knowledge:  Poor  Language:  Poor  Akathisia:  No  Handed:  Right  AIMS (if indicated):     Assets:  Resilience  ADL's:  Impaired  Cognition:  Impaired,  Severe  Sleep:        Treatment Plan Summary: See below  Disposition: If medically appropriate, consider further work up to rule out other causes of acute mental status changes /delirium.   - Benzodiazepine WDL may be a contributor, but at this time patient patient not presenting with overt agitation, restlessness , tremors , diaphoresis.  Would consider decreasing dose of Alprazolam PRN  and avoiding BZDs or other sedating medications at this time , unless needed for agitation . - If medically appropriate, would consider working up for other potential causes of delirium- particularly potential infectious process, as WBC has trended up.  - At this time I do not think it is likely  that antidepressant management is contributing to delirium, but                - Would D/C Elavil, as may be contributing to sedation              - Of note, SSRIs can contribute to Hyponatremia, but I do not suspect that hyponatremia is main cause of mental status changes - consider tapering Celexa dose down .                 - Celexa , particularly in combination with anticoagulants, could increase risk of GI bleed due to altered platelet function, so would consider tapering off if it is felt it could be a contributor to her bleed.               - Presentation not currently suggestive of Serotonin  Syndrome, but she has received Tramadol ( 8/28, 8/29) and is on SSRI, so it could be a possible etiology .     Neita Garnet, MD 01/07/2016 9:28 AM

## 2016-01-07 NOTE — Progress Notes (Signed)
CSW is available to assist with d/c planning needs. PN reviewed. PT recommends HHPT vs SNF depending on pt's progression. MD notes pt is confused and safety sitter required. Psych is following. CSW will assist with SNF placement, once it is determined that placement is needed and pt/family are in agreement with plan.   Cori RazorJamie Armando Bukhari LCSW [phone]

## 2016-01-07 NOTE — Progress Notes (Signed)
Pharmacy Antibiotic Note  Diane DallasBrenda Hernandez is a 70 y.o. female currently being treated for GIB.  UA on 8/31 with large leukocytes.  To start cipro for UTI.   Plan: - cipro 400 gm IV q24h  _________________________  Height: 5\' 4"  (162.6 cm) Weight: 227 lb 8.2 oz (103.2 kg) IBW/kg (Calculated) : 54.7  Temp (24hrs), Avg:98 F (36.7 C), Min:97.6 F (36.4 C), Max:98.3 F (36.8 C)   Recent Labs Lab 01/04/16 1525 01/05/16 0538 01/05/16 0958 01/05/16 1312 01/06/16 0215 01/07/16 1031  WBC 9.3 10.3  --   --  11.2* 11.8*  CREATININE 1.34* 1.36*  --   --  1.34*  1.35*  --   LATICACIDVEN  --   --  1.1 1.7  --   --     Estimated Creatinine Clearance: 46 mL/min (by C-G formula based on SCr of 1.35 mg/dL).    Allergies  Allergen Reactions  . Omnicef [Cefdinir] Diarrhea  . Penicillins Hives    Has patient had a PCN reaction causing immediate rash, facial/tongue/throat swelling, SOB or lightheadedness with hypotension: No Has patient had a PCN reaction causing severe rash involving mucus membranes or skin necrosis: Yes Has patient had a PCN reaction that required hospitalization No Has patient had a PCN reaction occurring within the last 10 years: No If all of the above answers are "NO", then may proceed with Cephalosporin use.   . Prednisone Other (See Comments)    Makes very jittery      Thank you for allowing pharmacy to be a part of this patient's care.  Diane Hernandez, Diane Hernandez P 01/07/2016 4:59 PM

## 2016-01-07 NOTE — Progress Notes (Signed)
After patient bath patient became wheezy, pt stated she was breathing fine, O2 saturation maintains in 100% range. 2L O2 placed for comfort. Patient easily aroused, confused to place and situation but oriented to self and date. (Baseline this admission) Charge RN assessed patient as well, mid level paged to notify will monitor.

## 2016-01-07 NOTE — Progress Notes (Signed)
Asked to be in conference with family by Dr Cena BentonVega. The family includes husband, 2 daughters, 1 sister, 1 sister-n-law, and 1 other member. The family is highly agitated and frustrated with the unknown reasons for Diane Hernandez's decreased LOC and slurred speech. They are requesting she be [facility]. The daughter insists she has had a Stroke due to the daughter's perception that Diane Nevel's mouth is drooping to the right side, slurred speech and that she is "not right." Diane Ruffner's husband is extremely angry at Dr Elnoria HowardHung for not seeing any blood in the colonoscopy. Using profanity to describe how he feels. They do not believe it is from hemorrhoids as was told to them by Dr Elnoria HowardHung. They have multiple questions and when one asks a question, several will additionally add in to conversation. They have questioned why she didn't get her Xanax when admitted. (Diane Huston FoleyBrady has exhibited signs of benzo withdrawal).  It was explained that it was ordered on a PRN basis. The sister asked why the health care team didn't ask the family how the patient's take their medications. They have requested she receive Xanax as scheduled dose. Dr Cena BentonVega explained it could make her sleepier, however they have explained several times that Diane Huston FoleyBrady has been on Xanax since 1989. They didn't agree that mg dose be decreased even if it made her sleepier. Daughter asked why don't you want her sleepy with it? Dr Cena BentonVega confirmed the Psychiatrist recommended a decreased dosage due to lethargy. Dr Cena BentonVega explained the patient does have a UTI and has been started on antibiotics now and that could cause  lethargy also. Over all plan is to get the results of the CT Scan and go from there. Family will decide if they want [facility].

## 2016-01-07 NOTE — Progress Notes (Addendum)
PROGRESS NOTE    Diane Hernandez  JYN:[id number] DOB: 06-21-1945 DOA: 01/04/2016 PCP: Dema Severin, NP  Outpatient Specialists:   Brief Narrative: 70 y.o. female with medical history significant of hypertension, hyperlipidemia, obesity, hypothyroidism, presents to the [facility] emergency room with complaints of 3 day of what she initially thought was vaginal bleeding. Patient describes that she has been incontinent and rushing to the bathroom, and she saw bright red blood in the toilet bowl. She thinks she had about a dozen episodes over the last 3 days, this morning's episode was with a lot of blood, she got scared and decided to come to the emergency room. This has never happened to her before. She never had a colonoscopy, does not have a gastroenterologist. She denies any fever or chills, denies any chest pain, complains of mild lightheadedness denies any dizziness. She has no abdominal pain, no nausea, vomiting or diarrhea. She was about in the emergency room there, she underwent a pelvic exam which showed no evidence of vaginal bleeding however digital rectal bleeding. Her vital signs are stable, blood work remarkable for hemoglobin of 9.8 with unknown baseline. White count is 9.8, platelets are 306, sodium 1:30, potassium 4.7, chloride 101, bicarbonate 20, BUN 36 and creatinine 1.5. Glucose was 97. Liver enzymes are unremarkable. [facility] does not have a GI coverage and patient was transferred to [facility] for further evaluation.  In addition, patient complains of recurrent falls, poor balance, and recently had a rib fracture.  Assessment & Plan:   Active Problems:   GI bleed   Hypothyroidism   HTN (hypertension)   Depression   HLD (hyperlipidemia)   GI bleed  - GI consulted and investigating. Pt had poor prep prior to flexible sigmoidoscopy - s/p transfusion of 2 units of PRBC  AMS - Pt is confused and has been combative. Has required restraints as she tries to get up from bed. She  is at risk of falls and as such we are watching her closely in the step down unit. Nursing reports that she has hit staff as well. - I suspect that this is secondary to benzodiazepine withdrawal. Discussed with nursing so patient gets her xanax as she was taking at home. Reportedly since the start of this hospital stay (per my discussion with husband) she was not taking her xanax like she was at home.   Hyponatremia - Most likely due to volume depletion.   Acidosis, likely metabolic - resolved. Lactic acid normal. Last reported anion gap of 8  Abdominal Distension - abd kub reported no bowel obstruction  Hypothyroidism  - Continue Synthroid.  Hypertension  - stable  ? Chronic kidney disease vs AKI - Creatinine 1.5, unknown baseline, continue to monitor  Hyperlipidemia - Continue simvastatin  Bilateral lower extremity swelling - Likely venous insufficiency, she is no orthopnea, no crackles on exam, no JVD, less likely cardiac - continue TED compression  Recurrent falls -  PT consult placed and they have recommended home health PT vs SNF  Candidiasis vulva - place order for diflucan x 1 on 01/06/16  Wheezes - Pt denies any history of asthma - Occurred after bath. Place order for albuterol. Should avoid agent that caused her wheezes  DVT prophylaxis: SCD  Code Status: Full  Family Communication:   Disposition Plan: medsurg Consults called: GI  Admission status: obs   Procedures:   None  Antimicrobials:   None  Subjective: Pt has no new complaints. Does not remember much of yesterday  Objective: Vitals:  01/07/16 0400 01/07/16 0445 01/07/16 0700 01/07/16 0800  BP:  112/67  (!) 124/41  Pulse: (!) 111 (!) 111 (!) 107 (!) 108  Resp: (!) 31 (!) 23 (!) 24 (!) 22  Temp:      TempSrc:      SpO2: 100% 100% 100% 100%  Weight:      Height:        Intake/Output Summary (Last 24 hours) at 01/07/16 0837 Last data filed at 01/07/16 0800  Gross per 24 hour    Intake             2750 ml  Output             2250 ml  Net              500 ml   Filed Weights   01/05/16 1519 01/06/16 0458  Weight: 116 kg (255 lb 11.7 oz) 103.2 kg (227 lb 8.2 oz)    Examination:  General exam: Appears calm and comfortable. Morbidly obese. In NAD. Respiratory system: Clear to auscultation. Respiratory effort normal. Cardiovascular system: S1 & S2, rrr Gastrointestinal system: Abdomen is obese, soft and nontender. No guarding Central nervous system: Alert and oriented to self and time but not place. Moves all limbs. Extremities: Symmetric 5 x 5 power.   Data Reviewed: I have personally reviewed following labs and imaging studies  CBC:  Recent Labs Lab 01/04/16 1525 01/05/16 0538 01/05/16 1426 01/06/16 0215 01/06/16 0823  WBC 9.3 10.3  --  11.2*  --   NEUTROABS  --   --   --  8.8*  --   HGB 9.8* 7.8* 7.8* 9.0* 8.9*  HCT 29.4* 23.3* 22.7* 26.3* 26.4*  MCV 86.5 85.0  --  82.2  --   PLT 353 302  --  242  --    Basic Metabolic Panel:  Recent Labs Lab 01/04/16 1525 01/05/16 0538 01/06/16 0215  NA 133* 131* 131*  131*  K 4.5 4.5 4.3  4.3  CL 104 103 104  104  CO2 23 19* 19*  19*  GLUCOSE 106* 127* 106*  107*  BUN 32* 32* 31*  30*  CREATININE 1.34* 1.36* 1.34*  1.35*  CALCIUM 9.3 8.5* 8.2*  8.2*  MG  --   --  1.8  PHOS  --   --  3.1  3.1   GFR: Estimated Creatinine Clearance: 46 mL/min (by C-G formula based on SCr of 1.35 mg/dL). Liver Function Tests:  Recent Labs Lab 01/04/16 1525 01/06/16 0215  AST 34 39  ALT 20 19  ALKPHOS 69 59  BILITOT 0.7 1.1  PROT 7.6 6.0*  ALBUMIN 3.8 3.1*  3.1*   No results for input(s): LIPASE, AMYLASE in the last 168 hours. No results for input(s): AMMONIA in the last 168 hours. Coagulation Profile:  Recent Labs Lab 01/04/16 1525  INR 1.06   Cardiac Enzymes: No results for input(s): CKTOTAL, CKMB, CKMBINDEX, TROPONINI in the last 168 hours. BNP (last 3 results) No results for input(s):  PROBNP in the last 8760 hours. HbA1C: No results for input(s): HGBA1C in the last 72 hours. CBG: No results for input(s): GLUCAP in the last 168 hours. Lipid Profile: No results for input(s): CHOL, HDL, LDLCALC, TRIG, CHOLHDL, LDLDIRECT in the last 72 hours. Thyroid Function Tests:  Recent Labs  01/04/16 1525  TSH 2.727   Anemia Panel: No results for input(s): VITAMINB12, FOLATE, FERRITIN, TIBC, IRON, RETICCTPCT in the last 72 hours. Urine analysis: No results found  for: COLORURINE, APPEARANCEUR, LABSPEC, PHURINE, GLUCOSEU, HGBUR, BILIRUBINUR, KETONESUR, PROTEINUR, UROBILINOGEN, NITRITE, LEUKOCYTESUR Sepsis Labs: @LABRCNTIP (procalcitonin:4,lacticidven:4)  ) Recent Results (from the past 240 hour(s))  MRSA PCR Screening     Status: Abnormal   Collection Time: 01/05/16  3:32 PM  Result Value Ref Range Status   MRSA by PCR POSITIVE (A) NEGATIVE Final    Comment:        The GeneXpert MRSA Assay (FDA approved for NASAL specimens only), is one component of a comprehensive MRSA colonization surveillance program. It is not intended to diagnose MRSA infection nor to guide or monitor treatment for MRSA infections. RESULT CALLED TO, READ BACK BY AND VERIFIED WITH: Fabian November [facility] [id number] @ 1710 BY J SCOTTON          Radiology Studies: Dg Abd 1 View  Result Date: 01/05/2016 CLINICAL DATA:  Abdominal distension EXAM: ABDOMEN - 1 VIEW COMPARISON:  None. FINDINGS: Supine views the abdomen show no evidence of bowel obstruction. No opaque calculi are seen. No bowel edema is evident by plain film. There is contrast within the pelvocaliceal systems and within the urinary bladder most likely due to recent IV contrast media administration. There are degenerative changes in the mid to lower lumbar spine. IMPRESSION: 1. No bowel obstruction. 2. Contrast distended urinary bladder.  Correlate clinically. Electronically Signed   By: Dwyane Dee M.D.   On: 01/05/2016 11:17         Scheduled Meds: . amitriptyline  50 mg Oral QHS  . Chlorhexidine Gluconate Cloth  6 each Topical Q0600  . citalopram  40 mg Oral Daily  . famotidine (PEPCID) IV  20 mg Intravenous Q12H  . levothyroxine  112 mcg Oral QAC breakfast  . mouth rinse  15 mL Mouth Rinse BID  . metoprolol tartrate  12.5 mg Oral BID  . mupirocin ointment  1 application Nasal BID  . simvastatin  20 mg Oral q1800   Continuous Infusions: . sodium chloride 100 mL/hr at 01/07/16 0400     LOS: 3 days    Time spent: Greater than 35 Minutes.    Penny Pia [facility] Pager #: [phone] 7PM-7AM contact night coverage as above  Addendum: D/c psychiatry. Will cut celexa dose down. Place routine blood work orders to assess for altered mental status. Cut down her xanax due to hyper somnolence. Will obtain cbc and look for source of infection as part of AMS work up.   Family concerned about mental status change. Had family meeting and discussed mental status with family and what my suspicion was. Family requested that patient go back on her xanax scheduled as that is what she was doing at home.  Husband was demanding MRI I explained that a CT scan of the head would suffice in diagnosing a stroke and if there was any abnormality an MRI of the head may be obtained. All questions were answered to their satisfaction.   Additional work up would reveal a urinalysis suspicious for UTI. It had hgb, leukocytes, and positive nitrite. Patient is allergic to omnicef and penicillin. We will place on Ciprofloxacin for now.

## 2016-01-07 NOTE — Progress Notes (Signed)
Foley sterilly placed after thorough pericare, patients vagina has large amounts of yeast. Foley clogged with yeast on insertion no urine return, foley removed and a new foley placed. 820 ml of cloudy urine with sediment.

## 2016-01-08 LAB — CBC WITH DIFFERENTIAL/PLATELET
BASOS ABS: 0 10*3/uL (ref 0.0–0.1)
BASOS PCT: 0 %
EOS ABS: 0.2 10*3/uL (ref 0.0–0.7)
Eosinophils Relative: 2 %
HCT: 26 % — ABNORMAL LOW (ref 36.0–46.0)
Hemoglobin: 8.5 g/dL — ABNORMAL LOW (ref 12.0–15.0)
LYMPHS PCT: 15 %
Lymphs Abs: 1.5 10*3/uL (ref 0.7–4.0)
MCH: 28.1 pg (ref 26.0–34.0)
MCHC: 32.7 g/dL (ref 30.0–36.0)
MCV: 85.8 fL (ref 78.0–100.0)
MONO ABS: 1.2 10*3/uL — AB (ref 0.1–1.0)
Monocytes Relative: 12 %
NEUTROS ABS: 7.2 10*3/uL (ref 1.7–7.7)
Neutrophils Relative %: 71 %
PLATELETS: 268 10*3/uL (ref 150–400)
RBC: 3.03 MIL/uL — ABNORMAL LOW (ref 3.87–5.11)
RDW: 15.9 % — AB (ref 11.5–15.5)
WBC: 10.1 10*3/uL (ref 4.0–10.5)

## 2016-01-08 LAB — COMPREHENSIVE METABOLIC PANEL
ALBUMIN: 2.8 g/dL — AB (ref 3.5–5.0)
ALK PHOS: 64 U/L (ref 38–126)
ALT: 26 U/L (ref 14–54)
ANION GAP: 5 (ref 5–15)
AST: 42 U/L — ABNORMAL HIGH (ref 15–41)
BILIRUBIN TOTAL: 0.9 mg/dL (ref 0.3–1.2)
BUN: 6 mg/dL (ref 6–20)
CALCIUM: 8.3 mg/dL — AB (ref 8.9–10.3)
CO2: 20 mmol/L — AB (ref 22–32)
CREATININE: 0.64 mg/dL (ref 0.44–1.00)
Chloride: 113 mmol/L — ABNORMAL HIGH (ref 101–111)
GFR calc non Af Amer: >60 mL/min (ref >60)
GLUCOSE: 102 mg/dL — AB (ref 65–99)
Potassium: 3.9 mmol/L (ref 3.5–5.1)
Sodium: 138 mmol/L (ref 135–145)
TOTAL PROTEIN: 6 g/dL — AB (ref 6.5–8.1)

## 2016-01-08 LAB — HIV ANTIBODY (ROUTINE TESTING W REFLEX): HIV SCREEN 4TH GENERATION: NONREACTIVE

## 2016-01-08 LAB — RPR: RPR: NONREACTIVE

## 2016-01-08 MED ORDER — ACETAMINOPHEN 325 MG PO TABS
650.0000 mg | ORAL_TABLET | Freq: Four times a day (QID) | ORAL | Status: DC | PRN
  Administered 2016-01-08 – 2016-01-09 (×3): 650 mg via ORAL
  Filled 2016-01-08 (×3): qty 2

## 2016-01-08 MED ORDER — HYDROCODONE-HOMATROPINE 5-1.5 MG/5ML PO SYRP
5.0000 mL | ORAL_SOLUTION | Freq: Four times a day (QID) | ORAL | Status: DC | PRN
  Administered 2016-01-08 – 2016-01-09 (×2): 5 mL via ORAL
  Filled 2016-01-08 (×2): qty 5

## 2016-01-08 NOTE — Progress Notes (Signed)
PROGRESS NOTE    Kiran Lapine  ZOX:[id number] DOB: 05/04/1946 DOA: 01/04/2016 PCP: Dema Severin, NP  Outpatient Specialists:   Brief Narrative: 70 y.o. female with medical history significant of hypertension, hyperlipidemia, obesity, hypothyroidism, presents to the [facility] emergency room with complaints of 3 day of what she initially thought was vaginal bleeding. Patient describes that she has been incontinent and rushing to the bathroom, and she saw bright red blood in the toilet bowl. She thinks she had about a dozen episodes over the last 3 days, this morning's episode was with a lot of blood, she got scared and decided to come to the emergency room. This has never happened to her before. She never had a colonoscopy, does not have a gastroenterologist. She denies any fever or chills, denies any chest pain, complains of mild lightheadedness denies any dizziness. She has no abdominal pain, no nausea, vomiting or diarrhea. She was about in the emergency room there, she underwent a pelvic exam which showed no evidence of vaginal bleeding however digital rectal bleeding. Her vital signs are stable, blood work remarkable for hemoglobin of 9.8 with unknown baseline. White count is 9.8, platelets are 306, sodium 1:30, potassium 4.7, chloride 101, bicarbonate 20, BUN 36 and creatinine 1.5. Glucose was 97. Liver enzymes are unremarkable. [facility] does not have a GI coverage and patient was transferred to [place] [facility] for further evaluation.  In addition, patient complains of recurrent falls, poor balance, and recently had a rib fracture.  Assessment & Plan:   Active Problems:   GI bleed   Hypothyroidism   HTN (hypertension)   Depression   HLD (hyperlipidemia)   GI bleed  - GI consulted and investigated. Pt is s/p transfusion of 2 units of prbc's.  Currently thought is patient had GI bleed from hemorrhoids. No active bleeding currently  AMS - Suspected to be mulitfactorial. Most likely initially  secondary to benzodiazepine withdrawal and most likely UTI.   UTI - Will start patient on Cipro empirically secondary to listed penicillin and omnicef allergy  Hyponatremia - Most likely due to volume depletion.  - resolved  Acidosis, likely metabolic - resolved. Lactic acid normal. Last reported anion gap of 8  Abdominal Distension - abd kub reported no bowel obstruction  Hypothyroidism  - Continue Synthroid.  Hypertension  - stable  AKI - resolved  Hyperlipidemia - Continue simvastatin  Bilateral lower extremity swelling - Likely venous insufficiency, she is no orthopnea, no crackles on exam, no JVD, less likely cardiac - continue TED compression  Recurrent falls -  PT consult placed and they have recommended home health PT vs SNF  Candidiasis vulva - place order for diflucan x 1 on 01/06/16  Wheezes - Pt denies any history of asthma - Occurred after bath.  - Place order for albuterol.   DVT prophylaxis: SCD  Code Status: Full  Family Communication:   Disposition Plan: medsurg Consults called: GI  Admission status: obs   Procedures:   None  Antimicrobials:   None  Subjective: No acute issues overnight.   Objective: Vitals:   01/08/16 0500 01/08/16 0600 01/08/16 0800 01/08/16 0900  BP:  (!) 172/48 (!) 127/43   Pulse: 89 100 87 91  Resp: 17 18 18 19   Temp:   98.5 F (36.9 C)   TempSrc:   Oral   SpO2: 100% 100% 99% 100%  Weight:      Height:        Intake/Output Summary (Last 24 hours) at 01/08/16 1036 Last data  filed at 01/08/16 1000  Gross per 24 hour  Intake             3140 ml  Output             1105 ml  Net             2035 ml   Filed Weights   01/05/16 1519 01/06/16 0458  Weight: 116 kg (255 lb 11.7 oz) 103.2 kg (227 lb 8.2 oz)    Examination:  General exam: Appears calm and comfortable. Morbidly obese. In NAD. Respiratory system: Clear to auscultation. Respiratory effort normal. Cardiovascular system: S1 & S2,  rrr Gastrointestinal system: Abdomen is obese, soft and nontender. No guarding Central nervous system: Alert and oriented to self and time but not place. Moves all limbs. Extremities: Symmetric 5 x 5 power.   Data Reviewed: I have personally reviewed following labs and imaging studies  CBC:  Recent Labs Lab 01/04/16 1525 01/05/16 0538 01/05/16 1426 01/06/16 0215 01/06/16 0823 01/07/16 1031 01/08/16 0323  WBC 9.3 10.3  --  11.2*  --  11.8* 10.1  NEUTROABS  --   --   --  8.8*  --   --  7.2  HGB 9.8* 7.8* 7.8* 9.0* 8.9* 8.6* 8.5*  HCT 29.4* 23.3* 22.7* 26.3* 26.4* 25.6* 26.0*  MCV 86.5 85.0  --  82.2  --  83.7 85.8  PLT 353 302  --  242  --  255 268   Basic Metabolic Panel:  Recent Labs Lab 01/04/16 1525 01/05/16 0538 01/06/16 0215 01/08/16 0323  NA 133* 131* 131*  131* 138  K 4.5 4.5 4.3  4.3 3.9  CL 104 103 104  104 113*  CO2 23 19* 19*  19* 20*  GLUCOSE 106* 127* 106*  107* 102*  BUN 32* 32* 31*  30* 6  CREATININE 1.34* 1.36* 1.34*  1.35* 0.64  CALCIUM 9.3 8.5* 8.2*  8.2* 8.3*  MG  --   --  1.8  --   PHOS  --   --  3.1  3.1  --    GFR: Estimated Creatinine Clearance: 77.6 mL/min (by C-G formula based on SCr of 0.8 mg/dL). Liver Function Tests:  Recent Labs Lab 01/04/16 1525 01/06/16 0215 01/08/16 0323  AST 34 39 42*  ALT 20 19 26   ALKPHOS 69 59 64  BILITOT 0.7 1.1 0.9  PROT 7.6 6.0* 6.0*  ALBUMIN 3.8 3.1*  3.1* 2.8*   No results for input(s): LIPASE, AMYLASE in the last 168 hours.  Recent Labs Lab 01/07/16 1031  AMMONIA 13   Coagulation Profile:  Recent Labs Lab 01/04/16 1525  INR 1.06   Cardiac Enzymes: No results for input(s): CKTOTAL, CKMB, CKMBINDEX, TROPONINI in the last 168 hours. BNP (last 3 results) No results for input(s): PROBNP in the last 8760 hours. HbA1C: No results for input(s): HGBA1C in the last 72 hours. CBG: No results for input(s): GLUCAP in the last 168 hours. Lipid Profile: No results for input(s):  CHOL, HDL, LDLCALC, TRIG, CHOLHDL, LDLDIRECT in the last 72 hours. Thyroid Function Tests: No results for input(s): TSH, T4TOTAL, FREET4, T3FREE, THYROIDAB in the last 72 hours. Anemia Panel:  Recent Labs  01/07/16 1031  VITAMINB12 1,986*  FOLATE 59.1   Urine analysis:    Component Value Date/Time   COLORURINE YELLOW 01/07/2016 1207   APPEARANCEUR CLOUDY (A) 01/07/2016 1207   LABSPEC 1.014 01/07/2016 1207   PHURINE 5.5 01/07/2016 1207   GLUCOSEU NEGATIVE 01/07/2016 1207  HGBUR LARGE (A) 01/07/2016 1207   BILIRUBINUR NEGATIVE 01/07/2016 1207   KETONESUR 40 (A) 01/07/2016 1207   PROTEINUR 30 (A) 01/07/2016 1207   NITRITE POSITIVE (A) 01/07/2016 1207   LEUKOCYTESUR LARGE (A) 01/07/2016 1207   Sepsis Labs: @LABRCNTIP (procalcitonin:4,lacticidven:4)  ) Recent Results (from the past 240 hour(s))  MRSA PCR Screening     Status: Abnormal   Collection Time: 01/05/16  3:32 PM  Result Value Ref Range Status   MRSA by PCR POSITIVE (A) NEGATIVE Final    Comment:        The GeneXpert MRSA Assay (FDA approved for NASAL specimens only), is one component of a comprehensive MRSA colonization surveillance program. It is not intended to diagnose MRSA infection nor to guide or monitor treatment for MRSA infections. RESULT CALLED TO, READ BACK BY AND VERIFIED WITH: Fabian NovemberSHAE [facility] - LlanoCHAFFER,RN [id number] @ 1710 BY J SCOTTON          Radiology Studies: Ct Head Wo Contrast  Result Date: 01/07/2016 CLINICAL DATA:  Increased confusion. Questionable stroke. History of hypertension. EXAM: CT HEAD WITHOUT CONTRAST TECHNIQUE: Contiguous axial images were obtained from the base of the skull through the vertex without intravenous contrast. COMPARISON:  None. FINDINGS: Brain: The ventricles are normal in configuration. There is mild ventricular and sulcal enlargement reflecting mild generalized atrophy. No hydrocephalus. There are no parenchymal masses or mass effect. There is no evidence of an infarct.  There are no extra-axial masses or abnormal fluid collections. There is no intracranial hemorrhage. Vascular: No hyperdense vessel or unexpected calcification. Skull: No skull lesion. Sinuses/Orbits: Visualize globes and orbits are unremarkable. Visualized sinuses are clear. Other: Clear mastoid air cells. IMPRESSION: 1. No acute intracranial abnormalities. 2. Mild atrophy. Electronically Signed   By: Amie Portlandavid  Ormond M.D.   On: 01/07/2016 17:31   Dg Chest Port 1 View  Result Date: 01/07/2016 CLINICAL DATA:  Wheezing. EXAM: PORTABLE CHEST 1 VIEW COMPARISON:  None. FINDINGS: The heart size and mediastinal contours are within normal limits. Right lung is clear. Minimal left basilar subsegmental atelectasis is noted. No pneumothorax or pleural effusion is noted. The visualized skeletal structures are unremarkable. IMPRESSION: Minimal left basilar subsegmental atelectasis. Electronically Signed   By: Lupita RaiderJames  Green Jr, M.D.   On: 01/07/2016 10:26        Scheduled Meds: . ALPRAZolam  0.25 mg Oral Q6H  . Chlorhexidine Gluconate Cloth  6 each Topical Q0600  . ciprofloxacin  400 mg Intravenous Q12H  . citalopram  20 mg Oral Daily  . famotidine (PEPCID) IV  20 mg Intravenous Q12H  . levothyroxine  112 mcg Oral QAC breakfast  . mouth rinse  15 mL Mouth Rinse BID  . metoprolol tartrate  12.5 mg Oral BID  . mupirocin ointment  1 application Nasal BID  . simvastatin  20 mg Oral q1800   Continuous Infusions: . sodium chloride 100 mL/hr at 01/08/16 1000     LOS: 4 days    Time spent: Greater than 35 Minutes.    Penny PiaVEGA, Geneieve Duell [facility] Pager #: [phone] 7PM-7AM contact night coverage as above

## 2016-01-08 NOTE — Progress Notes (Signed)
Patient transferred to the floor by wheelchair. Report was called to receiving nurse. Upon transfer all pt VSS, A/O x4 and patient is not in any acute distress.  Family was called and daughter Angelique BlonderDenise was updated.  All belongings were transferred with patient.

## 2016-01-08 NOTE — Progress Notes (Signed)
Physical Therapy Treatment Patient Details Name: Diane Hernandez MRN: [id number] DOB: 12-25-1945 Today's Date: 01/08/2016    History of Present Illness Diane Hernandez is a 70 year old, morbidly white female who was transferred to [facility] from [facility] after she presented there with a three-day history of rectal bleeding; Hx: HTN, depression    PT Comments    Marked improvement in activity tolerance and pt cognitive status and level of alertness.  Follow Up Recommendations  Home health PT;SNF     Equipment Recommendations       Recommendations for Other Services       Precautions / Restrictions Precautions Precautions: Fall Restrictions Weight Bearing Restrictions: No    Mobility  Bed Mobility               General bed mobility comments: Pt sitting at EOB on arrival to room.  Per dtr, pt unassisted but with HOB elevated  Transfers Overall transfer level: Needs assistance Equipment used: Rolling walker (2 wheeled) Transfers: Sit to/from Stand Sit to Stand: Min assist         General transfer comment: assist to rise and steady.  Cues for use of UEs to self assist and for safe transition position  Ambulation/Gait Ambulation/Gait assistance: Min assist Ambulation Distance (Feet): 450 Feet Assistive device: Rolling walker (2 wheeled) Gait Pattern/deviations: Step-through pattern;Decreased step length - right;Decreased step length - left;Shuffle;Trunk flexed;Wide base of support Gait velocity: decr Gait velocity interpretation: Below normal speed for age/gender General Gait Details: cues for posture and position from RW   Stairs            Wheelchair Mobility    Modified Rankin (Stroke Patients Only)       Balance Overall balance assessment: Needs assistance Sitting-balance support: No upper extremity supported;Feet supported Sitting balance-Leahy Scale: Good     Standing balance support: Bilateral upper extremity supported Standing  balance-Leahy Scale: Fair                      Cognition Arousal/Alertness: Awake/alert Behavior During Therapy: Restless Overall Cognitive Status: Impaired/Different from baseline Area of Impairment: Safety/judgement;Following commands       Following Commands: Follows one step commands inconsistently Safety/Judgement: Decreased awareness of safety;Decreased awareness of deficits          Exercises      General Comments        Pertinent Vitals/Pain Pain Assessment: No/denies pain    Home Living                      Prior Function            PT Goals (current goals can now be found in the care plan section) Acute Rehab PT Goals Patient Stated Goal: Home Time For Goal Achievement: 01/20/16 Potential to Achieve Goals: Good Progress towards PT goals: Progressing toward goals    Frequency  Min 3X/week    PT Plan Current plan remains appropriate    Co-evaluation             End of Session Equipment Utilized During Treatment: Gait belt Activity Tolerance: Patient tolerated treatment well Patient left: in chair;with call bell/phone within reach;with chair alarm set;with family/visitor present     Time: 4098-1191 PT Time Calculation (min) (ACUTE ONLY): 24 min  Charges:  $Gait Training: 23-37 mins                    G Codes:  Diane Hernandez 01/08/2016, 5:45 PM

## 2016-01-09 MED ORDER — CIPROFLOXACIN HCL 500 MG PO TABS: 500.0000 mg | ORAL_TABLET | Freq: Two times a day (BID) | ORAL | 0 refills | Status: AC

## 2016-01-09 NOTE — Progress Notes (Signed)
CSW spoke with patients daughter and they would like Home Health once discharged. CSW followed-up with CM.   Diane Hernandez, LCSWA Clinical Social Worker [phone]

## 2016-01-09 NOTE — Discharge Summary (Signed)
Physician Discharge Summary  Diane Hernandez WUJ:[id number] DOB: [birth year] DOA: 01/04/2016  PCP: Dema Severin, NP  Admit date: 01/04/2016 Discharge date: 01/09/2016  Time spent: > 35 minutes  Recommendations for Outpatient Follow-up:  1. Monitor hgb levels 2. Pt will be discharged with 5 more days of antibiotics for treatment of UTI   Discharge Diagnoses:  Active Problems:   GI bleed   Hypothyroidism   HTN (hypertension)   Depression   HLD (hyperlipidemia)   Discharge Condition: Stable  Diet recommendation: Heart healthy  Filed Weights   01/05/16 1519 01/06/16 0458  Weight: 116 kg (255 lb 11.7 oz) 103.2 kg (227 lb 8.2 oz)    History of present illness:  70 y.o. female with medical history significant of hypertension, hyperlipidemia, obesity, hypothyroidism, presents to the [facility] emergency room with complaints of 3 day of rectal bleeding. Hospital course was complicated by AMS secondary to benzodiazepine withdrawal and UTI.   Hospital Course:  GI bleed  - GI consulted and investigated. Pt is s/p transfusion of 2 units of prbc's.  Currently thought is patient had GI bleed from hemorrhoids. No active bleeding currently  AMS - resolved with continuation of benzodiazepine and antibiotic  UTI - Treat for 7 days with cipro. Family and patient did not want to wait for final urine culture results  Hyponatremia - Most likely due to volume depletion.  - resolved  Acidosis, likely metabolic - resolved. Lactic acid normal. Last reported anion gap of 8  Abdominal Distension - resolved  Hypothyroidism  - Continue Synthroid.  Hypertension  - will continue prior to admission medication regimen.  AKI - resolved  Hyperlipidemia - Continue simvastatin  Bilateral lower extremity swelling - Likely venous insufficiency, she is no orthopnea, no crackles on exam, no JVD, less likely cardiac - continue TED compression  Recurrent falls -  PT consult placed and they  have recommended home health PT vs SNF  Candidiasis vulva - place order for diflucan x 1 on 01/06/16  Wheezes - resolved and occurred after patient had a bath  Procedures:  Gastroenterology  Consultations:  Psychiatry  GI: Hung  Discharge Exam: Vitals:   01/08/16 2113 01/09/16 0500  BP: (!) 133/51 (!) 151/76  Pulse: 92 (!) 103  Resp: 18 15  Temp: 97.8 F (36.6 C) 98.2 F (36.8 C)    General: Pt in nad, alert and awake Cardiovascular: rrr, no rubs Respiratory: no increased wob, no wheezes  Discharge Instructions   Discharge Instructions    Call MD for:  difficulty breathing, headache or visual disturbances    Complete by:  As directed   Call MD for:  severe uncontrolled pain    Complete by:  As directed   Call MD for:  temperature >100.4    Complete by:  As directed   Diet - low sodium heart healthy    Complete by:  As directed   Discharge instructions    Complete by:  As directed   Please be sure to follow up with your primary care physician in the next 1 or 2 weeks or sooner should any new concerns arise.   Increase activity slowly    Complete by:  As directed     Current Discharge Medication List    START taking these medications   Details  ciprofloxacin (CIPRO) 500 MG tablet Take 1 tablet (500 mg total) by mouth 2 (two) times daily. Qty: 10 tablet, Refills: 0      CONTINUE these medications which have NOT CHANGED  Details  ALPRAZolam (XANAX) 0.5 MG tablet Take 0.5 mg by mouth 3 (three) times daily as needed for anxiety.    amitriptyline (ELAVIL) 50 MG tablet Take 50 mg by mouth every evening.    cholecalciferol (VITAMIN D) 1000 units tablet Take 1,000 Units by mouth daily with breakfast.    citalopram (CELEXA) 40 MG tablet Take 40 mg by mouth daily with breakfast.    estradiol (VIVELLE-DOT) 0.05 MG/24HR patch Place 1 patch onto the skin 2 (two) times a week. On Tuesday's and Saturday's    ferrous sulfate 325 (65 FE) MG tablet Take 325 mg by mouth  daily with breakfast.    fexofenadine (ALLEGRA) 180 MG tablet Take 180 mg by mouth daily with breakfast.    folic acid (FOLVITE) 800 MCG tablet Take 800 mcg by mouth daily with breakfast.    levothyroxine (SYNTHROID, LEVOTHROID) 112 MCG tablet Take 112 mcg by mouth at bedtime.    lisinopril (PRINIVIL,ZESTRIL) 10 MG tablet Take 10 mg by mouth at bedtime.    medroxyPROGESTERone (PROVERA) 2.5 MG tablet Take 2.5 mg by mouth daily with breakfast.    metoprolol tartrate (LOPRESSOR) 25 MG tablet Take 25 mg by mouth at bedtime.    Multiple Vitamins-Minerals (CENTRUM ADULTS) TABS Take 1 tablet by mouth daily with breakfast.    simvastatin (ZOCOR) 20 MG tablet Take 20 mg by mouth at bedtime.       Allergies  Allergen Reactions  . Omnicef [Cefdinir] Diarrhea  . Penicillins Hives    Has patient had a PCN reaction causing immediate rash, facial/tongue/throat swelling, SOB or lightheadedness with hypotension: No Has patient had a PCN reaction causing severe rash involving mucus membranes or skin necrosis: Yes Has patient had a PCN reaction that required hospitalization No Has patient had a PCN reaction occurring within the last 10 years: No If all of the above answers are "NO", then may proceed with Cephalosporin use.   . Prednisone Other (See Comments)    Makes very jittery       The results of significant diagnostics from this hospitalization (including imaging, microbiology, ancillary and laboratory) are listed below for reference.    Significant Diagnostic Studies: Dg Abd 1 View  Result Date: 01/05/2016 CLINICAL DATA:  Abdominal distension EXAM: ABDOMEN - 1 VIEW COMPARISON:  None. FINDINGS: Supine views the abdomen show no evidence of bowel obstruction. No opaque calculi are seen. No bowel edema is evident by plain film. There is contrast within the pelvocaliceal systems and within the urinary bladder most likely due to recent IV contrast media administration. There are degenerative  changes in the mid to lower lumbar spine. IMPRESSION: 1. No bowel obstruction. 2. Contrast distended urinary bladder.  Correlate clinically. Electronically Signed   By: Dwyane Dee M.D.   On: 01/05/2016 11:17   Ct Head Wo Contrast  Result Date: 01/07/2016 CLINICAL DATA:  Increased confusion. Questionable stroke. History of hypertension. EXAM: CT HEAD WITHOUT CONTRAST TECHNIQUE: Contiguous axial images were obtained from the base of the skull through the vertex without intravenous contrast. COMPARISON:  None. FINDINGS: Brain: The ventricles are normal in configuration. There is mild ventricular and sulcal enlargement reflecting mild generalized atrophy. No hydrocephalus. There are no parenchymal masses or mass effect. There is no evidence of an infarct. There are no extra-axial masses or abnormal fluid collections. There is no intracranial hemorrhage. Vascular: No hyperdense vessel or unexpected calcification. Skull: No skull lesion. Sinuses/Orbits: Visualize globes and orbits are unremarkable. Visualized sinuses are clear. Other: Clear mastoid air  cells. IMPRESSION: 1. No acute intracranial abnormalities. 2. Mild atrophy. Electronically Signed   By: Amie Portlandavid  Ormond M.D.   On: 01/07/2016 17:31   Dg Chest Port 1 View  Result Date: 01/07/2016 CLINICAL DATA:  Wheezing. EXAM: PORTABLE CHEST 1 VIEW COMPARISON:  None. FINDINGS: The heart size and mediastinal contours are within normal limits. Right lung is clear. Minimal left basilar subsegmental atelectasis is noted. No pneumothorax or pleural effusion is noted. The visualized skeletal structures are unremarkable. IMPRESSION: Minimal left basilar subsegmental atelectasis. Electronically Signed   By: Lupita RaiderJames  Green Jr, M.D.   On: 01/07/2016 10:26    Microbiology: Recent Results (from the past 240 hour(s))  MRSA PCR Screening     Status: Abnormal   Collection Time: 01/05/16  3:32 PM  Result Value Ref Range Status   MRSA by PCR POSITIVE (A) NEGATIVE Final     Comment:        The GeneXpert MRSA Assay (FDA approved for NASAL specimens only), is one component of a comprehensive MRSA colonization surveillance program. It is not intended to diagnose MRSA infection nor to guide or monitor treatment for MRSA infections. RESULT CALLED TO, READ BACK BY AND VERIFIED WITH: [facility] CenterHAE SCHAFFER,RN [phone] @ 1710 BY J SCOTTON      Labs: Basic Metabolic Panel:  Recent Labs Lab 01/04/16 1525 01/05/16 0538 01/06/16 0215 01/08/16 0323  NA 133* 131* 131*  131* 138  K 4.5 4.5 4.3  4.3 3.9  CL 104 103 104  104 113*  CO2 23 19* 19*  19* 20*  GLUCOSE 106* 127* 106*  107* 102*  BUN 32* 32* 31*  30* 6  CREATININE 1.34* 1.36* 1.34*  1.35* 0.64  CALCIUM 9.3 8.5* 8.2*  8.2* 8.3*  MG  --   --  1.8  --   PHOS  --   --  3.1  3.1  --    Liver Function Tests:  Recent Labs Lab 01/04/16 1525 01/06/16 0215 01/08/16 0323  AST 34 39 42*  ALT 20 19 26   ALKPHOS 69 59 64  BILITOT 0.7 1.1 0.9  PROT 7.6 6.0* 6.0*  ALBUMIN 3.8 3.1*  3.1* 2.8*   No results for input(s): LIPASE, AMYLASE in the last 168 hours.  Recent Labs Lab 01/07/16 1031  AMMONIA 13   CBC:  Recent Labs Lab 01/04/16 1525 01/05/16 0538 01/05/16 1426 01/06/16 0215 01/06/16 0823 01/07/16 1031 01/08/16 0323  WBC 9.3 10.3  --  11.2*  --  11.8* 10.1  NEUTROABS  --   --   --  8.8*  --   --  7.2  HGB 9.8* 7.8* 7.8* 9.0* 8.9* 8.6* 8.5*  HCT 29.4* 23.3* 22.7* 26.3* 26.4* 25.6* 26.0*  MCV 86.5 85.0  --  82.2  --  83.7 85.8  PLT 353 302  --  242  --  255 268   Cardiac Enzymes: No results for input(s): CKTOTAL, CKMB, CKMBINDEX, TROPONINI in the last 168 hours. BNP: BNP (last 3 results) No results for input(s): BNP in the last 8760 hours.  ProBNP (last 3 results) No results for input(s): PROBNP in the last 8760 hours.  CBG: No results for input(s): GLUCAP in the last 168 hours.  Signed:  Penny PiaVEGA, Derinda Bartus MD.  [facility] 01/09/2016, 11:17 AM

## 2016-01-09 NOTE — Progress Notes (Signed)
[facility]  CM called room and spoke with family who chooses [facility] to render HHPT.  [facility], Tiffany.  Pt has both a rollling walker and a 3n1 from spouse's past knee surgery. No other CM needs were communicated.

## 2016-01-09 NOTE — Progress Notes (Signed)
Pharmacy Antibiotic Note  Diane Hernandez is a 70 y.o. female currently being treated for GIB.  UA on 8/31 with large leukocytes. Pharmacy consulted to start ciprofloxacin for UTI on 8/31.   9/2: Renal fxn improved, urine culture still in process.   Plan:  Continue cipro 400mg  IV q12h Dosage remains stable and need for further dosage adjustment appears unlikely at present.    Will sign off at this time.  Please reconsult if a change in clinical status warrants re-evaluation of dosage.  Haynes Hoehnolleen Mariaeduarda Defranco, PharmD, BCPS 01/09/2016, 9:32 AM  Pager: [phone]    _________________________  Height: 5\' 4"  (162.6 cm) Weight: 227 lb 8.2 oz (103.2 kg) IBW/kg (Calculated) : 54.7  Temp (24hrs), Avg:97.9 F (36.6 C), Min:97.7 F (36.5 C), Max:98.2 F (36.8 C)   Recent Labs Lab 01/04/16 1525 01/05/16 0538 01/05/16 0958 01/05/16 1312 01/06/16 0215 01/07/16 1031 01/08/16 0323  WBC 9.3 10.3  --   --  11.2* 11.8* 10.1  CREATININE 1.34* 1.36*  --   --  1.34*  1.35*  --  0.64  LATICACIDVEN  --   --  1.1 1.7  --   --   --     Estimated Creatinine Clearance: 77.6 mL/min (by C-G formula based on SCr of 0.8 mg/dL).    Allergies  Allergen Reactions  . Omnicef [Cefdinir] Diarrhea  . Penicillins Hives    Has patient had a PCN reaction causing immediate rash, facial/tongue/throat swelling, SOB or lightheadedness with hypotension: No Has patient had a PCN reaction causing severe rash involving mucus membranes or skin necrosis: Yes Has patient had a PCN reaction that required hospitalization No Has patient had a PCN reaction occurring within the last 10 years: No If all of the above answers are "NO", then may proceed with Cephalosporin use.   . Prednisone Other (See Comments)    Makes very jittery      Thank you for allowing pharmacy to be a part of this patient's care.

## 2016-01-10 LAB — URINE CULTURE

## 2017-05-12 IMAGING — CT CT HEAD W/O CM
3 of 4 series · 16 of 47 positions shown, 19 images · non-contrast
Comparison: None.

CLINICAL DATA: Increased confusion. Questionable stroke. History of
hypertension.

EXAM:
CT HEAD WITHOUT CONTRAST
TECHNIQUE: Contiguous axial images were obtained from the base of the skull
through the vertex without intravenous contrast.

[Series 2: headseq wo 4.8 h45s · axial · 0.43mm/px · z∈[-117,+14]mm · 10 of 33 slices shown, 13 images]
[im 3/33  brain]
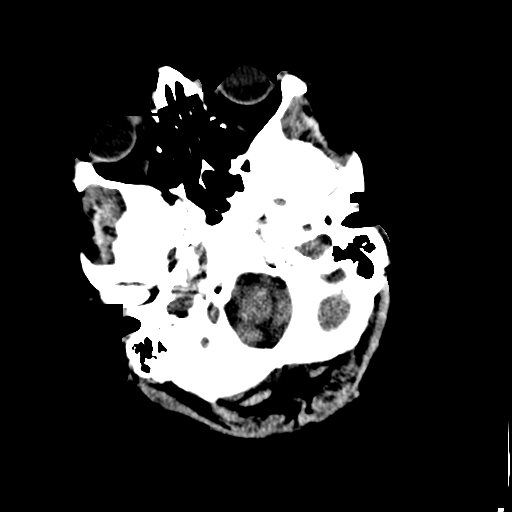
[im 3/33  bone]
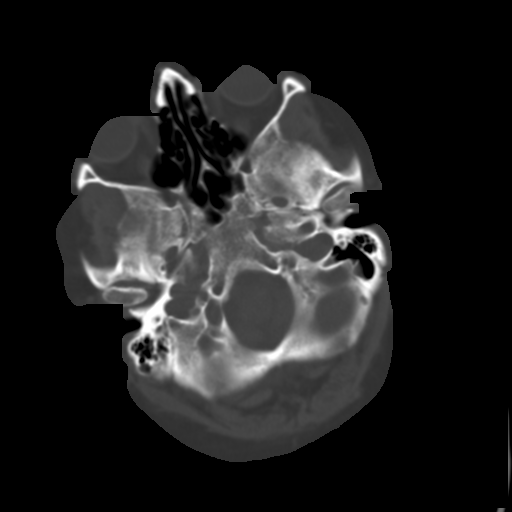
[im 5/33  brain]
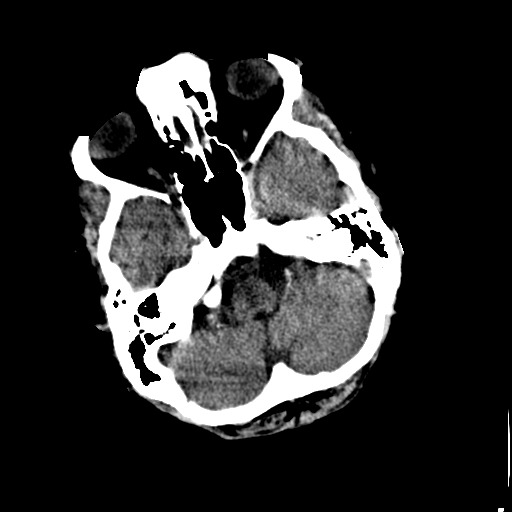
[im 10/33  brain]
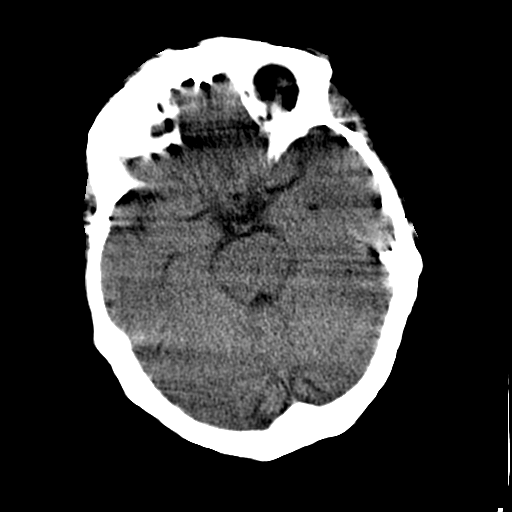
[im 12/33  brain]
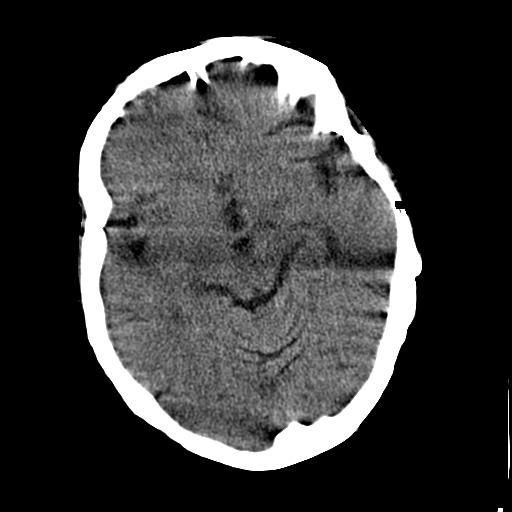
[im 14/33  brain]
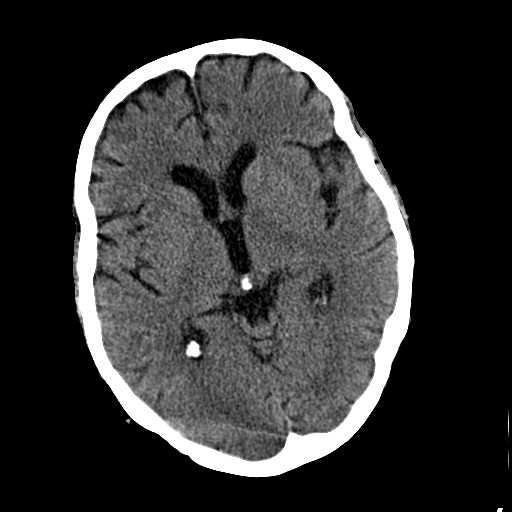
[im 14/33  bone]
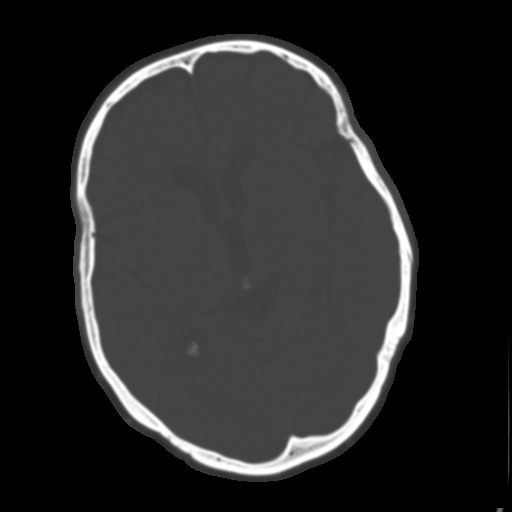
[im 19/33  brain]
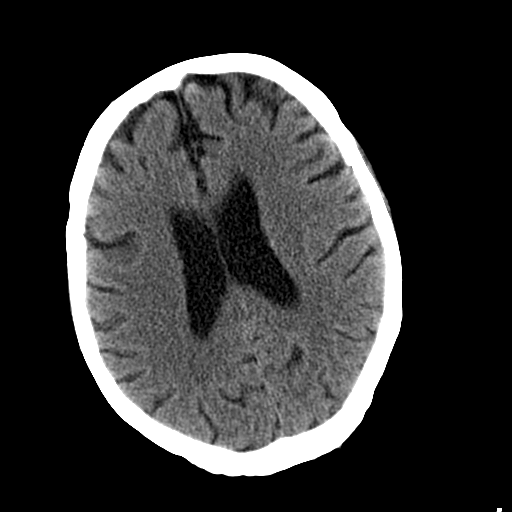
[im 21/33  brain]
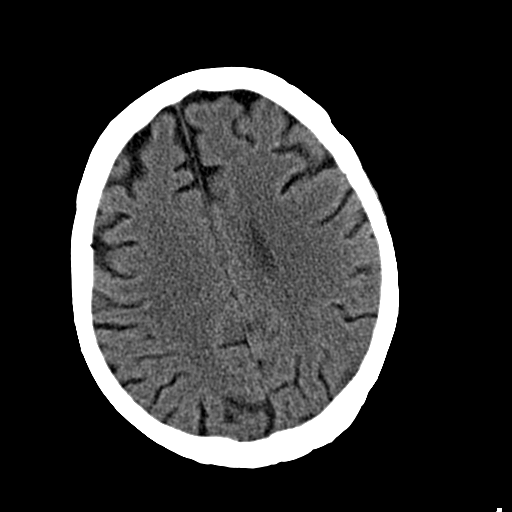
[im 23/33  brain]
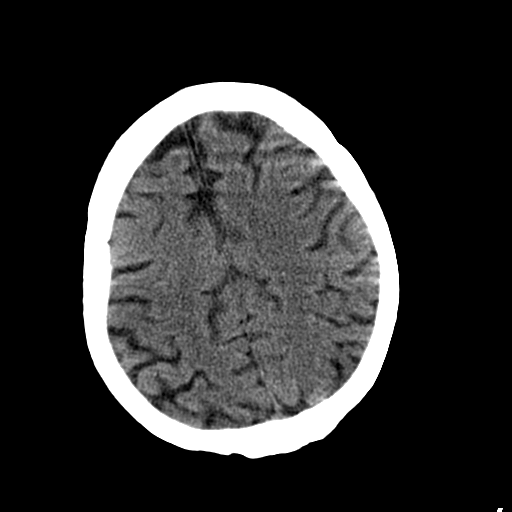
[im 28/33  brain]
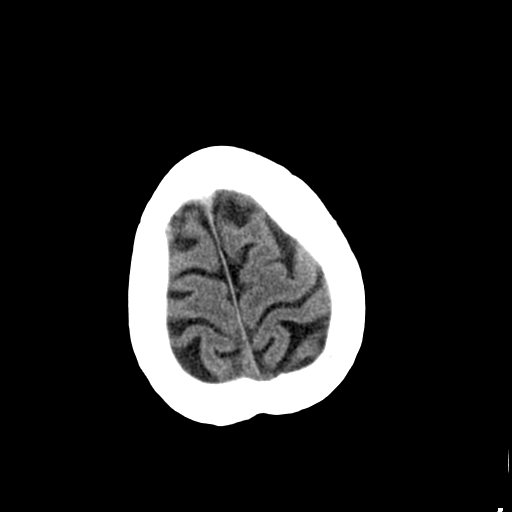
[im 28/33  bone]
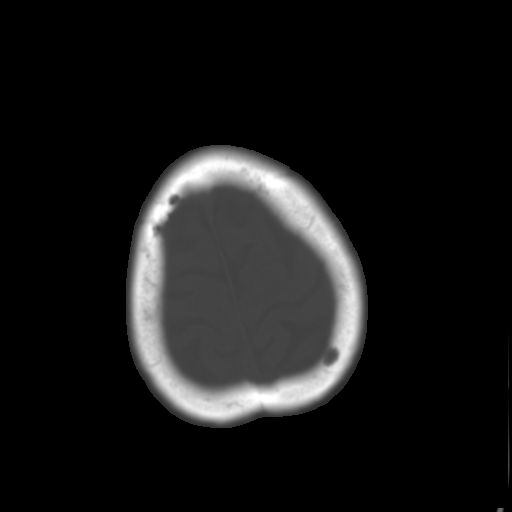
[im 30/33  brain]
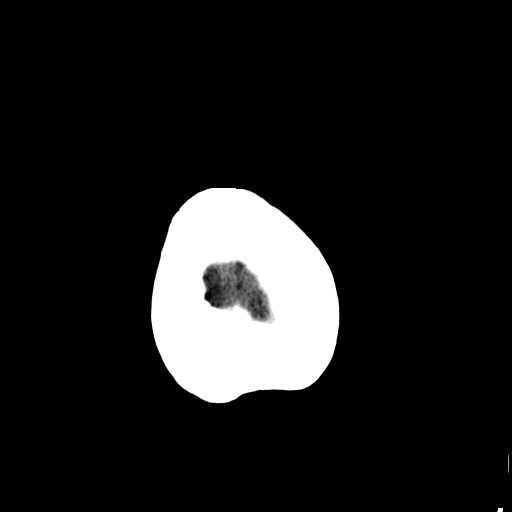

[Series 602: cor · coronal · 0.48mm/px · 3 of 64 slices shown]
[im 22/64  brain]
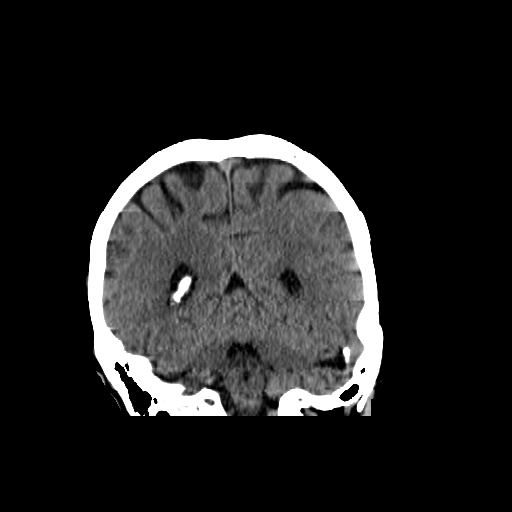
[im 29/64  brain]
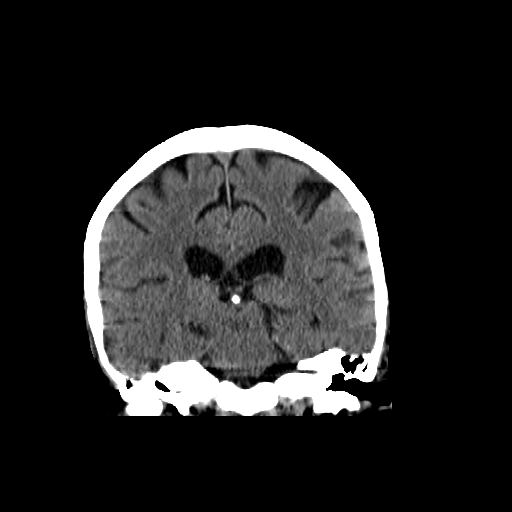
[im 36/64  brain]
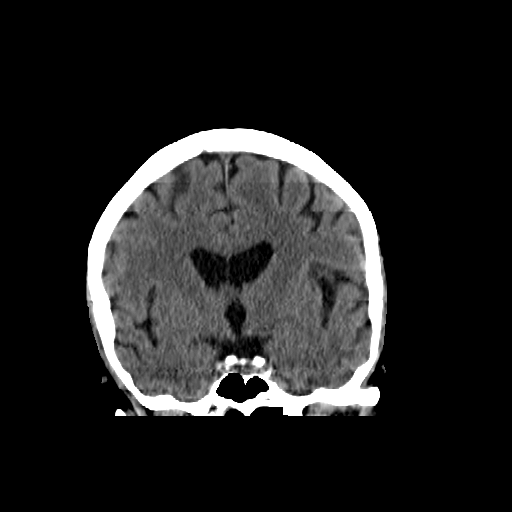

[Series 603: sag · sagittal · 0.48mm/px · 3 of 49 slices shown]
[im 17/49  brain]
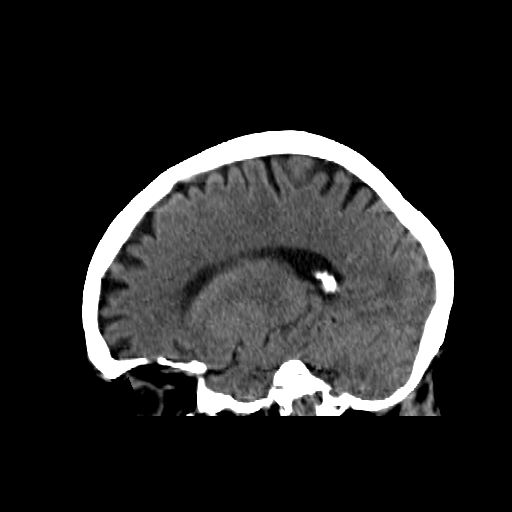
[im 25/49  brain]
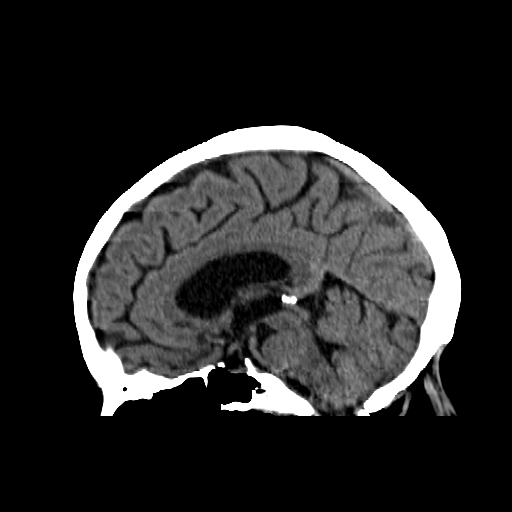
[im 33/49  brain]
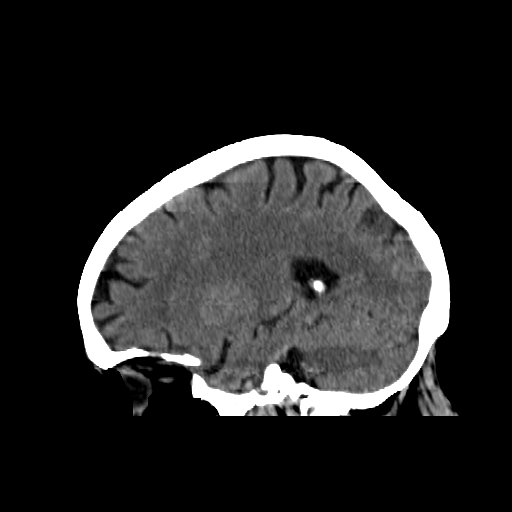

[16 of 47 positions shown; findings below may reference images not displayed]

FINDINGS: Brain: The ventricles are normal in configuration. There is mild
ventricular and sulcal enlargement reflecting mild generalized
atrophy. No hydrocephalus.

There are no parenchymal masses or mass effect. There is no evidence
of an infarct.

There are no extra-axial masses or abnormal fluid collections.

There is no intracranial hemorrhage.

Vascular: No hyperdense vessel or unexpected calcification.

Skull: No skull lesion.

Sinuses/Orbits: Visualize globes and orbits are unremarkable.
Visualized sinuses are clear.

Other: Clear mastoid air cells.
IMPRESSION: 1. No acute intracranial abnormalities.
2. Mild atrophy.

## 2022-09-15 ENCOUNTER — Other Ambulatory Visit (HOSPITAL_COMMUNITY): Payer: Self-pay

## 2023-02-16 ENCOUNTER — Other Ambulatory Visit: Payer: Self-pay

## 2023-02-16 DIAGNOSIS — R9431 Abnormal electrocardiogram [ECG] [EKG]: Secondary | ICD-10-CM | POA: Insufficient documentation

## 2023-02-16 DIAGNOSIS — E559 Vitamin D deficiency, unspecified: Secondary | ICD-10-CM | POA: Insufficient documentation

## 2023-02-16 DIAGNOSIS — M130 Polyarthritis, unspecified: Secondary | ICD-10-CM | POA: Insufficient documentation

## 2023-02-16 DIAGNOSIS — J329 Chronic sinusitis, unspecified: Secondary | ICD-10-CM | POA: Insufficient documentation

## 2023-02-16 DIAGNOSIS — F339 Major depressive disorder, recurrent, unspecified: Secondary | ICD-10-CM | POA: Insufficient documentation

## 2023-02-16 DIAGNOSIS — R011 Cardiac murmur, unspecified: Secondary | ICD-10-CM | POA: Insufficient documentation

## 2023-02-16 DIAGNOSIS — E538 Deficiency of other specified B group vitamins: Secondary | ICD-10-CM | POA: Insufficient documentation

## 2023-02-16 DIAGNOSIS — Z6839 Body mass index (BMI) 39.0-39.9, adult: Secondary | ICD-10-CM | POA: Insufficient documentation

## 2023-02-16 DIAGNOSIS — I10 Essential (primary) hypertension: Secondary | ICD-10-CM | POA: Insufficient documentation

## 2023-02-16 DIAGNOSIS — F419 Anxiety disorder, unspecified: Secondary | ICD-10-CM | POA: Insufficient documentation

## 2023-02-16 DIAGNOSIS — J309 Allergic rhinitis, unspecified: Secondary | ICD-10-CM | POA: Insufficient documentation

## 2023-02-17 ENCOUNTER — Encounter: Payer: Self-pay | Admitting: Cardiology

## 2023-02-17 ENCOUNTER — Ambulatory Visit: Payer: Medicare Other | Attending: Cardiology | Admitting: Cardiology

## 2023-02-17 VITALS — BP 158/80 | HR 67 | Ht 62.6 in | Wt 216.6 lb

## 2023-02-17 DIAGNOSIS — E785 Hyperlipidemia, unspecified: Secondary | ICD-10-CM | POA: Insufficient documentation

## 2023-02-17 DIAGNOSIS — Z6839 Body mass index (BMI) 39.0-39.9, adult: Secondary | ICD-10-CM | POA: Diagnosis present

## 2023-02-17 NOTE — Progress Notes (Signed)
Cardiology Office Note:    Date:  02/17/2023   ID:  Diane Hernandez, DOB 17-May-1945, MRN [id number]  PCP:  Erskine Emery, NP  Cardiologist:  Garwin Brothers, MD   Referring MD: Erskine Emery, NP    ASSESSMENT:    1. Hyperlipidemia, unspecified hyperlipidemia type   2. BMI 39.0-39.9,adult    PLAN:    In order of problems listed above:  Primary prevention stressed with the patient.  Importance of compliance with diet medication stressed and patient verbalized standing. She was advised to walk at least half an hour a day 5 days a week and she promises to do so. Essential hypertension: Blood pressure stable and diet was emphasized.  She has an element of whitecoat hypertension.  Blood pressures are better at home.  She told me the numbers. Mixed dyslipidemia: On lipid-lowering medications followed by primary care. Cardiac murmur: We will try to get a copy of the report and review it and let her know.  The office was closed today. Morbid obesity: Weight reduction stressed.  Diet emphasized.  Risks of obesity emphasized and she promises to do better. Risk stratification: Coronary perspective: I discussed calcium scoring and she is agreeable. Patient will be seen in follow-up appointment in 6 months or earlier if the patient has any concerns.    Medication Adjustments/Labs and Tests Ordered: Current medicines are reviewed at length with the patient today.  Concerns regarding medicines are outlined above.  Orders Placed This Encounter  Procedures   EKG 12-Lead   No orders of the defined types were placed in this encounter.    History of Present Illness:    Diane Hernandez is a 77 y.o. female who is being seen today for the evaluation of cardiac murmur at the request of Erskine Emery, NP.  Patient is a pleasant 77 year old female.  She has past medical history of essential hypertension, dyslipidemia, morbid obesity and leads a sedentary lifestyle.  She denies any chest pain  orthopnea or PND.  She takes care of activities of daily living.  She mentions to me that she has no significant dyspnea on exertion.  At the time of my evaluation, the patient is alert awake oriented and in no distress.  Past Medical History:  Diagnosis Date   Abnormal electrocardiogram    Allergic rhinitis    Anxiety disorder    BMI 39.0-39.9,adult    Cardiac murmur    Chronic sinusitis    Depression 01/04/2016   Essential (primary) hypertension    GI bleed 01/04/2016   HLD (hyperlipidemia) 01/04/2016   HTN (hypertension) 01/04/2016   Hypomagnesemia    Hypothyroidism    Major depressive disorder, recurrent (HCC)    Polyarthritis    Vitamin B12 deficiency    Vitamin D deficiency     Past Surgical History:  Procedure Laterality Date   ESOPHAGOGASTRODUODENOSCOPY N/A 01/05/2016   Procedure: ESOPHAGOGASTRODUODENOSCOPY (EGD);  Surgeon: Jeani Hawking, MD;  Location: [facility];  Service: Endoscopy;  Laterality: N/A;   FLEXIBLE SIGMOIDOSCOPY N/A 01/05/2016   Procedure: FLEXIBLE SIGMOIDOSCOPY;  Surgeon: Jeani Hawking, MD;  Location: [facility];  Service: Endoscopy;  Laterality: N/A;    Current Medications: Current Meds  Medication Sig   ALPRAZolam (XANAX) 0.5 MG tablet Take 0.5 mg by mouth 3 (three) times daily as needed for anxiety.   amitriptyline (ELAVIL) 50 MG tablet Take 50 mg by mouth every evening.   celecoxib (CELEBREX) 200 MG capsule Take 200 mg by mouth daily.   Cholecalciferol (VITAMIN D3)  125 MCG (5000 UT) TABS Take 5,000 Units by mouth daily.   citalopram (CELEXA) 40 MG tablet Take 40 mg by mouth daily with breakfast.   escitalopram (LEXAPRO) 20 MG tablet Take 20 mg by mouth daily.   fexofenadine (ALLEGRA) 180 MG tablet Take 180 mg by mouth daily with breakfast.   fluticasone (FLONASE) 50 MCG/ACT nasal spray Place 1 spray into both nostrils daily.   levothyroxine (SYNTHROID, LEVOTHROID) 112 MCG tablet Take 112 mcg by mouth at bedtime.   NYSTATIN powder Apply 1  Application topically 3 (three) times daily.   simvastatin (ZOCOR) 20 MG tablet Take 20 mg by mouth at bedtime.     Allergies:   Omnicef [cefdinir], Penicillins, and Prednisone   Social History   Socioeconomic History   Marital status: Married    Spouse name: Not on file   Number of children: Not on file   Years of education: Not on file   Highest education level: Not on file  Occupational History   Not on file  Tobacco Use   Smoking status: Passive Smoke Exposure - Never Smoker   Smokeless tobacco: Never  Substance and Sexual Activity   Alcohol use: Not on file   Drug use: Not on file   Sexual activity: Not on file  Other Topics Concern   Not on file  Social History Narrative   Not on file   Social Determinants of Health   Financial Resource Strain: Not on file  Food Insecurity: Not on file  Transportation Needs: Not on file  Physical Activity: Not on file  Stress: Not on file  Social Connections: Not on file     Family History: The patient's family history includes Dementia in her mother; Heart disease in her father; Hyperlipidemia in her father and mother; Hypertension in her mother.  ROS:   Please see the history of present illness.    All other systems reviewed and are negative.  EKGs/Labs/Other Studies Reviewed:    The following studies were reviewed today: .Marland KitchenEKG Interpretation Date/Time:  Friday February 17 2023 15:31:34 EDT Ventricular Rate:  67 PR Interval:  182 QRS Duration:  102 QT Interval:  428 QTC Calculation: 452 R Axis:   60  Text Interpretation: Normal sinus rhythm Cannot rule out Inferior infarct , age undetermined ST & T wave abnormality, consider lateral ischemia Abnormal ECG When compared with ECG of 05-Jan-2016 22:11, Minimal criteria for Inferior infarct are now Present T wave inversion now evident in Inferior leads T wave inversion now evident in Lateral leads Confirmed by Belva Crome [phone]) on 02/17/2023 3:36:26 PM    Recent  Labs: No results found for requested labs within last 365 days.  Recent Lipid Panel No results found for: "CHOL", "TRIG", "HDL", "CHOLHDL", "VLDL", "LDLCALC", "LDLDIRECT"  Physical Exam:    VS:  BP (!) 158/80   Pulse 67   Ht 5' 2.6" (1.59 m)   Wt 216 lb 9.6 oz (98.2 kg)   SpO2 94%   BMI 38.86 kg/m     Wt Readings from Last 3 Encounters:  02/17/23 216 lb 9.6 oz (98.2 kg)  01/06/16 227 lb 8.2 oz (103.2 kg)     GEN: Patient is in no acute distress HEENT: Normal NECK: No JVD; No carotid bruits LYMPHATICS: No lymphadenopathy CARDIAC: S1 S2 regular, 2/6 systolic murmur at the apex. RESPIRATORY:  Clear to auscultation without rales, wheezing or rhonchi  ABDOMEN: Soft, non-tender, non-distended MUSCULOSKELETAL:  No edema; No deformity  SKIN: Warm and dry NEUROLOGIC:  Alert and oriented x 3 PSYCHIATRIC:  Normal affect    Signed, Garwin Brothers, MD  02/17/2023 3:37 PM    [facility]

## 2023-02-17 NOTE — Patient Instructions (Signed)
Medication Instructions:  Your physician recommends that you continue on your current medications as directed. Please refer to the Current Medication list given to you today.  *If you need a refill on your cardiac medications before your next appointment, please call your pharmacy*   Lab Work: None If you have labs (blood work) drawn today and your tests are completely normal, you will receive your results only by: MyChart Message (if you have MyChart) OR A paper copy in the mail If you have any lab test that is abnormal or we need to change your treatment, we will call you to review the results.   Testing/Procedures: We will order CT coronary calcium score. It will cost $99.00 and iis due at time of scan.  Please call to schedule.     [facility] [address] [place], [place] [address] [phone]  Or  [facility] [address] [place], [place] [address] [phone] option 7    Follow-Up: At [facility], you and your health needs are our priority.  As part of our continuing mission to provide you with exceptional heart care, we have created designated Provider Care Teams.  These Care Teams include your primary Cardiologist (physician) and Advanced Practice Providers (APPs -  Physician Assistants and Nurse Practitioners) who all work together to provide you with the care you need, when you need it.  We recommend signing up for the patient portal called "MyChart".  Sign up information is provided on this After Visit Summary.  MyChart is used to connect with patients for Virtual Visits (Telemedicine).  Patients are able to view lab/test results, encounter notes, upcoming appointments, etc.  Non-urgent messages can be sent to your provider as well.   To learn more about what you can do with MyChart, go to [email or web address].    Your next appointment:   9 month(s)  Provider:   Belva Crome, MD    Other Instructions None

## 2023-02-28 ENCOUNTER — Telehealth (HOSPITAL_BASED_OUTPATIENT_CLINIC_OR_DEPARTMENT_OTHER): Payer: Self-pay
